# Patient Record
Sex: Female | Born: 1960 | Race: White | Hispanic: No | Smoking: Never smoker
Health system: Southern US, Community
[De-identification: ages and names within clinical notes are randomized; demographics above are authoritative.]

## PROBLEM LIST (undated history)

## (undated) DIAGNOSIS — D259 Leiomyoma of uterus, unspecified: Secondary | ICD-10-CM

## (undated) DIAGNOSIS — E559 Vitamin D deficiency, unspecified: Secondary | ICD-10-CM

## (undated) DIAGNOSIS — I10 Essential (primary) hypertension: Secondary | ICD-10-CM

## (undated) DIAGNOSIS — R7303 Prediabetes: Secondary | ICD-10-CM

## (undated) DIAGNOSIS — E785 Hyperlipidemia, unspecified: Secondary | ICD-10-CM

## (undated) DIAGNOSIS — M1990 Unspecified osteoarthritis, unspecified site: Secondary | ICD-10-CM

## (undated) HISTORY — DX: Vitamin D deficiency, unspecified: E55.9

## (undated) HISTORY — DX: Prediabetes: R73.03

## (undated) HISTORY — DX: Hyperlipidemia, unspecified: E78.5

## (undated) HISTORY — PX: MOUTH SURGERY: SHX715

## (undated) HISTORY — DX: Leiomyoma of uterus, unspecified: D25.9

## (undated) HISTORY — DX: Essential (primary) hypertension: I10

## (undated) HISTORY — DX: Unspecified osteoarthritis, unspecified site: M19.90

---

## 1986-07-02 HISTORY — PX: TUBAL LIGATION: SHX77

## 2001-05-30 ENCOUNTER — Other Ambulatory Visit: Admission: RE | Admit: 2001-05-30 | Discharge: 2001-05-30 | Payer: Self-pay | Admitting: Specialist

## 2003-08-13 ENCOUNTER — Other Ambulatory Visit: Admission: RE | Admit: 2003-08-13 | Discharge: 2003-08-13 | Payer: Self-pay | Admitting: Family Medicine

## 2020-06-05 ENCOUNTER — Encounter: Payer: Self-pay | Admitting: Physician Assistant

## 2020-06-05 ENCOUNTER — Ambulatory Visit (INDEPENDENT_AMBULATORY_CARE_PROVIDER_SITE_OTHER): Payer: 59 | Admitting: Physician Assistant

## 2020-06-05 ENCOUNTER — Other Ambulatory Visit: Payer: Self-pay

## 2020-06-05 VITALS — BP 212/91 | HR 73 | Temp 98.3°F | Resp 20 | Ht 70.0 in | Wt 232.0 lb

## 2020-06-05 DIAGNOSIS — I10 Essential (primary) hypertension: Secondary | ICD-10-CM

## 2020-06-05 DIAGNOSIS — M25561 Pain in right knee: Secondary | ICD-10-CM | POA: Diagnosis not present

## 2020-06-05 MED ORDER — LISINOPRIL 5 MG PO TABS
5.0000 mg | ORAL_TABLET | Freq: Every day | ORAL | 0 refills | Status: DC
Start: 2020-06-05 — End: 2020-07-03

## 2020-06-05 NOTE — Patient Instructions (Signed)

## 2020-06-05 NOTE — Progress Notes (Signed)
  Subjective:     Patient ID: Kara Mathis, female   DOB: 26-Apr-1961, 59 y.o.   MRN: [id number]  HPI Pt here to establish care States it has been around 47yrs since last visit for continued care Has had intermit sick visits with the last being 3-4 years ago Only concern today is R knee pain and HTN Denies injury to the R knee  Stands for long hours and has noted pain and swelling Sx have improved with OTC NSAIDS No prev tx for HTN  Review of Systems  Constitutional: Negative.   Respiratory: Negative.   Cardiovascular: Negative.   Gastrointestinal: Negative.   Musculoskeletal: Positive for arthralgias and joint swelling.  Skin: Negative.   Psychiatric/Behavioral: Negative.        Objective:   Physical Exam Vitals and nursing note reviewed.  Constitutional:      General: She is not in acute distress.    Appearance: Normal appearance. She is not ill-appearing or toxic-appearing.  Neck:     Vascular: No carotid bruit.  Cardiovascular:     Rate and Rhythm: Normal rate and regular rhythm.     Pulses: Normal pulses.     Heart sounds: Normal heart sounds.  Pulmonary:     Effort: Pulmonary effort is normal.     Breath sounds: Normal breath sounds.  Musculoskeletal:        General: Swelling present. Normal range of motion.     Cervical back: Normal range of motion and neck supple. No rigidity or tenderness.     Comments: General edema to the R knee FROM of the knee General TTP + patellar crepitus with ROM Good strength distal No laxity noted  Lymphadenopathy:     Cervical: No cervical adenopathy.  Skin:    General: Skin is warm and dry.  Neurological:     Mental Status: She is alert.        Assessment:     1. Hypertension, unspecified type   2. Recurrent pain of right knee        Plan:     Due to BP readings and FH will go ahead and start Zestril 5 mg qd CMP/Lipid panel pending and will inform of results Pt has kept up with mammogram/US due to fibrocystic  dz Pt keeps regular eye appt She is needing dental visit Nl course of knee pain reviewed Ice/Heat OTC meds prn F/U in 1 month for HTN

## 2020-06-06 LAB — CMP14+EGFR
ALT: 26 IU/L (ref 0–32)
AST: 18 IU/L (ref 0–40)
Albumin/Globulin Ratio: 1.7 (ref 1.2–2.2)
Albumin: 4.2 g/dL (ref 3.8–4.9)
Alkaline Phosphatase: 71 IU/L (ref 48–121)
BUN/Creatinine Ratio: 18 (ref 9–23)
BUN: 10 mg/dL (ref 6–24)
Bilirubin Total: 0.4 mg/dL (ref 0.0–1.2)
CO2: 25 mmol/L (ref 20–29)
Calcium: 9.1 mg/dL (ref 8.7–10.2)
Chloride: 104 mmol/L (ref 96–106)
Creatinine, Ser: 0.55 mg/dL — ABNORMAL LOW (ref 0.57–1.00)
GFR calc Af Amer: 119 mL/min/{1.73_m2} (ref >59)
GFR calc non Af Amer: 103 mL/min/{1.73_m2} (ref >59)
Globulin, Total: 2.5 g/dL (ref 1.5–4.5)
Glucose: 112 mg/dL — ABNORMAL HIGH (ref 65–99)
Potassium: 4.1 mmol/L (ref 3.5–5.2)
Sodium: 143 mmol/L (ref 134–144)
Total Protein: 6.7 g/dL (ref 6.0–8.5)

## 2020-06-06 LAB — LIPID PANEL
Chol/HDL Ratio: 4.5 ratio — ABNORMAL HIGH (ref 0.0–4.4)
Cholesterol, Total: 223 mg/dL — ABNORMAL HIGH (ref 100–199)
HDL: 50 mg/dL (ref >39)
LDL Chol Calc (NIH): 148 mg/dL — ABNORMAL HIGH (ref 0–99)
Triglycerides: 141 mg/dL (ref 0–149)
VLDL Cholesterol Cal: 25 mg/dL (ref 5–40)

## 2020-06-12 ENCOUNTER — Telehealth: Payer: Self-pay | Admitting: Family Medicine

## 2020-06-12 NOTE — Telephone Encounter (Signed)
Aware of lab results  

## 2020-06-24 ENCOUNTER — Encounter: Payer: Self-pay | Admitting: Family Medicine

## 2020-07-03 ENCOUNTER — Other Ambulatory Visit: Payer: Self-pay

## 2020-07-03 ENCOUNTER — Encounter: Payer: Self-pay | Admitting: Family Medicine

## 2020-07-03 ENCOUNTER — Ambulatory Visit (INDEPENDENT_AMBULATORY_CARE_PROVIDER_SITE_OTHER): Payer: 59 | Admitting: Family Medicine

## 2020-07-03 VITALS — BP 192/105 | HR 70 | Temp 97.7°F | Ht 70.0 in | Wt 228.8 lb

## 2020-07-03 DIAGNOSIS — R7301 Impaired fasting glucose: Secondary | ICD-10-CM

## 2020-07-03 DIAGNOSIS — I10 Essential (primary) hypertension: Secondary | ICD-10-CM | POA: Diagnosis not present

## 2020-07-03 DIAGNOSIS — E782 Mixed hyperlipidemia: Secondary | ICD-10-CM

## 2020-07-03 DIAGNOSIS — E119 Type 2 diabetes mellitus without complications: Secondary | ICD-10-CM

## 2020-07-03 DIAGNOSIS — M1990 Unspecified osteoarthritis, unspecified site: Secondary | ICD-10-CM

## 2020-07-03 DIAGNOSIS — E785 Hyperlipidemia, unspecified: Secondary | ICD-10-CM | POA: Insufficient documentation

## 2020-07-03 HISTORY — DX: Type 2 diabetes mellitus without complications: E11.9

## 2020-07-03 LAB — BAYER DCA HB A1C WAIVED: HB A1C (BAYER DCA - WAIVED): 6.8 % (ref <7.0)

## 2020-07-03 MED ORDER — LISINOPRIL 10 MG PO TABS: 10.0000 mg | ORAL_TABLET | Freq: Every day | ORAL | 2 refills | Status: DC

## 2020-07-03 NOTE — Progress Notes (Signed)
Assessment & Plan:  1. Essential hypertension - Uncontrolled. Lisinopril increased from 5 mg to 10 mg once daily.  Education provided on the DASH diet. - lisinopril (ZESTRIL) 10 MG tablet; Take 1 tablet (10 mg total) by mouth daily.  Dispense: 30 tablet; Refill: 2  2. Impaired fasting glucose - Encouraged to continue modifying diet. - Bayer DCA Hb A1c Waived = 6.8  3. Mixed hyperlipidemia - Education provided on hyperlipidemia.  Encouraged to work on diet.  4. Arthritis - Encouraged her to try ibuprofen and/or Voltaren gel to her right knee.  Discussed option of steroid injections at some point if she needs them.  Does not wish to do an x-ray today.   Return in about 4 weeks (around 07/31/2020) for HTN.  [name], MSN, APRN, FNP-C [facility]  Subjective:    Patient ID: Kara Mathis, female    DOB: [birth year], 60 y.o.   MRN: [id number]  Patient Care Team: [name], FNP as PCP - General (Family Medicine)   Chief Complaint:  Chief Complaint  Patient presents with  . Establish Care    Seen Bill for first visit.   Kara Mathis Hyperglycemia    A1c needed per last lab work.  . Hypertension    1 month follow up  . Arthritis    bilateral knees- 1 month follow up    HPI: Kara Mathis is a 59 y.o. female presenting on 07/03/2020 for Kara Mathis (Seen Bill for first visit. ), Hyperglycemia (A1c needed per last lab work.), Hypertension (1 month follow up), and Arthritis (bilateral knees- 1 month follow up)  Patient was seen 4 weeks ago at which time she was started on lisinopril 5 mg once daily.  She does not check her blood pressure at home.  She does not do any exercise.  She is drinking more water and has cut out fried foods and sweets.  Patient advised A1c would need to be ordered today due to fasting glucose of 112.  Patient's cholesterol levels were also elevated on her lab work.  Her ASCVD risk score is 10.3%  Patient has arthritis in her  right knee.  She is up on her feet all day at work at [employer].  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  She feels being up on her feet all day and compensating for the pain in her right knee is making her left knee hurt more.  She does wear a sleeve on her right knee every day.  She takes Tylenol 500 mg at bedtime which she reports is effective.  She does use " roll-on cooling pain relief" which is effective as well.  She does have two old goats cream and Voltaren gel at home that she has not yet tried.  She uses a walker at night and a cane during the day if she is not at work.  She does not use any assistive device at work.  She has not tried NSAIDs as she reports Aleve makes her heart race.  She cannot tolerate ibuprofen.   Social history:  Relevant past medical, surgical, family and social history reviewed and updated as indicated. Interim medical history since our last visit reviewed.  Allergies and medications reviewed and updated.  DATA REVIEWED: CHART IN EPIC  ROS: Negative unless specifically indicated above in HPI.    Current Outpatient Medications:  .  lisinopril (ZESTRIL) 5 MG tablet, Take 1 tablet (5 mg total) by  mouth daily., Disp: 30 tablet, Rfl: 0   Allergies  Allergen Reactions  . Asa [Aspirin]     Shaky and dizzy    Past Medical History:  Diagnosis Date  . Arthritis   . Hyperlipidemia   . Hypertension   . Type 2 diabetes mellitus ([place]) 07/03/2020   A1c 6.8  . Uterine fibroid   . Vitamin D deficiency     Past Surgical History:  Procedure Laterality Date  . MOUTH SURGERY    . TUBAL LIGATION  07/02/1986    Social History   Socioeconomic History  . Marital status: Widowed    Spouse name: Not on file  . Number of children: Not on file  . Years of education: Not on file  . Highest education level: Not on file  Occupational History  . Not on file  Tobacco Use  . Smoking status: Never Smoker  . Smokeless tobacco:  Never Used  Substance and Sexual Activity  . Alcohol use: Never  . Drug use: Never  . Sexual activity: Not on file  Other Topics Concern  . Not on file  Social History Narrative  . Not on file   Social Determinants of Health   Financial Resource Strain:   . Difficulty of Paying Living Expenses:   Food Insecurity:   . Worried About Charity fundraiser in the Last Year:   . Arboriculturist in the Last Year:   Transportation Needs:   . Film/video editor (Medical):   Kara Mathis Lack of Transportation (Non-Medical):   Physical Activity:   . Days of Exercise per Week:   . Minutes of Exercise per Session:   Stress:   . Feeling of Stress :   Social Connections:   . Frequency of Communication with Friends and Family:   . Frequency of Social Gatherings with Friends and Family:   . Attends Religious Services:   . Active Member of Clubs or Organizations:   . Attends Archivist Meetings:   Kara Mathis Marital Status:   Intimate Partner Violence:   . Fear of Current or Ex-Partner:   . Emotionally Abused:   Kara Mathis Physically Abused:   . Sexually Abused:         Objective:    BP (!) 192/105   Pulse 70   Temp 97.7 F (36.5 C) (Temporal)   Ht 5\' 10"  (1.778 m)   Wt 228 lb 12.8 oz (103.8 kg)   LMP 03/17/2017   SpO2 100%   BMI 32.83 kg/m   Wt Readings from Last 3 Encounters:  07/03/20 228 lb 12.8 oz (103.8 kg)  06/05/20 232 lb (105.2 kg)    Physical Exam Vitals reviewed.  Constitutional:      General: She is not in acute distress.    Appearance: Normal appearance. She is obese. She is not ill-appearing, toxic-appearing or diaphoretic.  HENT:     Head: Normocephalic and atraumatic.  Eyes:     General: No scleral icterus.       Right eye: No discharge.        Left eye: No discharge.     Conjunctiva/sclera: Conjunctivae normal.  Cardiovascular:     Rate and Rhythm: Normal rate and regular rhythm.     Heart sounds: Normal heart sounds. No murmur heard.  No friction rub. No  gallop.   Pulmonary:     Effort: Pulmonary effort is normal. No respiratory distress.     Breath sounds: Normal breath sounds. No stridor. No wheezing,  rhonchi or rales.  Musculoskeletal:        General: Normal range of motion.     Cervical back: Normal range of motion.  Skin:    General: Skin is warm and dry.     Capillary Refill: Capillary refill takes less than 2 seconds.  Neurological:     General: No focal deficit present.     Mental Status: She is alert and oriented to person, place, and time. Mental status is at baseline.     Gait: Gait abnormal (ambulates with cane).  Psychiatric:        Mood and Affect: Mood normal.        Behavior: Behavior normal.        Thought Content: Thought content normal.        Judgment: Judgment normal.     No results found for: TSH No results found for: WBC, HGB, HCT, MCV, PLT Lab Results  Component Value Date   NA 143 06/05/2020   K 4.1 06/05/2020   CO2 25 06/05/2020   GLUCOSE 112 (H) 06/05/2020   BUN 10 06/05/2020   CREATININE 0.55 (L) 06/05/2020   BILITOT 0.4 06/05/2020   ALKPHOS 71 06/05/2020   AST 18 06/05/2020   ALT 26 06/05/2020   PROT 6.7 06/05/2020   ALBUMIN 4.2 06/05/2020   CALCIUM 9.1 06/05/2020   Lab Results  Component Value Date   CHOL 223 (H) 06/05/2020   Lab Results  Component Value Date   HDL 50 06/05/2020   Lab Results  Component Value Date   LDLCALC 148 (H) 06/05/2020   Lab Results  Component Value Date   TRIG 141 06/05/2020   Lab Results  Component Value Date   CHOLHDL 4.5 (H) 06/05/2020   Lab Results  Component Value Date   HGBA1C 6.8 07/03/2020

## 2020-07-03 NOTE — Patient Instructions (Addendum)
DASH Eating Plan DASH stands for "Dietary Approaches to Stop Hypertension." The DASH eating plan is a healthy eating plan that has been shown to reduce high blood pressure (hypertension). It may also reduce your risk for type 2 diabetes, heart disease, and stroke. The DASH eating plan may also help with weight loss. What are tips for following this plan?  General guidelines  Avoid eating more than 2,300 mg (milligrams) of salt (sodium) a day. If you have hypertension, you may need to reduce your sodium intake to 1,500 mg a day.  Limit alcohol intake to no more than 1 drink a day for nonpregnant women and 2 drinks a day for men. One drink equals 12 oz of beer, 5 oz of wine, or 1 oz of hard liquor.  Work with your health care provider to maintain a healthy body weight or to lose weight. Ask what an ideal weight is for you.  Get at least 30 minutes of exercise that causes your heart to beat faster (aerobic exercise) most days of the week. Activities may include walking, swimming, or biking.  Work with your health care provider or diet and nutrition specialist (dietitian) to adjust your eating plan to your individual calorie needs. Reading food labels   Check food labels for the amount of sodium per serving. Choose foods with less than 5 percent of the Daily Value of sodium. Generally, foods with less than 300 mg of sodium per serving fit into this eating plan.  To find whole grains, look for the word "whole" as the first word in the ingredient list. Shopping  Buy products labeled as "low-sodium" or "no salt added."  Buy fresh foods. Avoid canned foods and premade or frozen meals. Cooking  Avoid adding salt when cooking. Use salt-free seasonings or herbs instead of table salt or sea salt. Check with your health care provider or pharmacist before using salt substitutes.  Do not fry foods. Cook foods using healthy methods such as baking, boiling, grilling, and broiling instead.  Cook  with heart-healthy oils, such as olive, canola, soybean, or sunflower oil. Meal planning  Eat a balanced diet that includes: ? 5 or more servings of fruits and vegetables each day. At each meal, try to fill half of your plate with fruits and vegetables. ? Up to 6-8 servings of whole grains each day. ? Less than 6 oz of lean meat, poultry, or fish each day. A 3-oz serving of meat is about the same size as a deck of cards. One egg equals 1 oz. ? 2 servings of low-fat dairy each day. ? A serving of nuts, seeds, or beans 5 times each week. ? Heart-healthy fats. Healthy fats called Omega-3 fatty acids are found in foods such as flaxseeds and coldwater fish, like sardines, salmon, and mackerel.  Limit how much you eat of the following: ? Canned or prepackaged foods. ? Food that is high in trans fat, such as fried foods. ? Food that is high in saturated fat, such as fatty meat. ? Sweets, desserts, sugary drinks, and other foods with added sugar. ? Full-fat dairy products.  Do not salt foods before eating.  Try to eat at least 2 vegetarian meals each week.  Eat more home-cooked food and less restaurant, buffet, and fast food.  When eating at a restaurant, ask that your food be prepared with less salt or no salt, if possible. What foods are recommended? The items listed may not be a complete list. Talk with your dietitian  about what dietary choices are best for you. Grains Whole-grain or whole-wheat bread. Whole-grain or whole-wheat pasta. Brown rice. [place]. Bulgur. Whole-grain and low-sodium cereals. Pita bread. Low-fat, low-sodium crackers. Whole-wheat flour tortillas. Vegetables Fresh or frozen vegetables (raw, steamed, roasted, or grilled). Low-sodium or reduced-sodium tomato and vegetable juice. Low-sodium or reduced-sodium tomato sauce and tomato paste. Low-sodium or reduced-sodium canned vegetables. Fruits All fresh, dried, or frozen fruit. Canned fruit in natural juice  (without added sugar). Meat and other protein foods Skinless chicken or [place]. Ground chicken or [place]. Pork with fat trimmed off. Fish and seafood. Egg whites. Dried beans, peas, or lentils. Unsalted nuts, nut butters, and seeds. Unsalted canned beans. Lean cuts of beef with fat trimmed off. Low-sodium, lean deli meat. Dairy Low-fat (1%) or fat-free (skim) milk. Fat-free, low-fat, or reduced-fat cheeses. Nonfat, low-sodium ricotta or cottage cheese. Low-fat or nonfat yogurt. Low-fat, low-sodium cheese. Fats and oils Soft margarine without trans fats. Vegetable oil. Low-fat, reduced-fat, or light mayonnaise and salad dressings (reduced-sodium). Canola, safflower, olive, soybean, and sunflower oils. Avocado. Seasoning and other foods Herbs. Spices. Seasoning mixes without salt. Unsalted popcorn and pretzels. Fat-free sweets. What foods are not recommended? The items listed may not be a complete list. Talk with your dietitian about what dietary choices are best for you. Grains Baked goods made with fat, such as croissants, muffins, or some breads. Dry pasta or rice meal packs. Vegetables Creamed or fried vegetables. Vegetables in a cheese sauce. Regular canned vegetables (not low-sodium or reduced-sodium). Regular canned tomato sauce and paste (not low-sodium or reduced-sodium). Regular tomato and vegetable juice (not low-sodium or reduced-sodium). Angie Fava. Olives. Fruits Canned fruit in a light or heavy syrup. Fried fruit. Fruit in cream or butter sauce. Meat and other protein foods Fatty cuts of meat. Ribs. Fried meat. Berniece Salines. Sausage. Bologna and other processed lunch meats. Salami. Fatback. Hotdogs. Bratwurst. Salted nuts and seeds. Canned beans with added salt. Canned or smoked fish. Whole eggs or egg yolks. Chicken or [place] with skin. Dairy Whole or 2% milk, cream, and half-and-half. Whole or full-fat cream cheese. Whole-fat or sweetened yogurt. Full-fat cheese. Nondairy creamers. Whipped  toppings. Processed cheese and cheese spreads. Fats and oils Butter. Stick margarine. Lard. Shortening. Ghee. Bacon fat. Tropical oils, such as coconut, palm kernel, or palm oil. Seasoning and other foods Salted popcorn and pretzels. Onion salt, garlic salt, seasoned salt, table salt, and sea salt. Worcestershire sauce. Tartar sauce. Barbecue sauce. Teriyaki sauce. Soy sauce, including reduced-sodium. Steak sauce. Canned and packaged gravies. Fish sauce. Oyster sauce. Cocktail sauce. Horseradish that you find on the shelf. Ketchup. Mustard. Meat flavorings and tenderizers. Bouillon cubes. Hot sauce and Tabasco sauce. Premade or packaged marinades. Premade or packaged taco seasonings. Relishes. Regular salad dressings. Where to find more information:  National Heart, Lung, and [place]: https://wilson-eaton.com/  American Heart Association: www.heart.org Summary  The DASH eating plan is a healthy eating plan that has been shown to reduce high blood pressure (hypertension). It may also reduce your risk for type 2 diabetes, heart disease, and stroke.  With the DASH eating plan, you should limit salt (sodium) intake to 2,300 mg a day. If you have hypertension, you may need to reduce your sodium intake to 1,500 mg a day.  When on the DASH eating plan, aim to eat more fresh fruits and vegetables, whole grains, lean proteins, low-fat dairy, and heart-healthy fats.  Work with your health care provider or diet and nutrition specialist (dietitian) to adjust your eating plan to  your individual calorie needs. This information is not intended to replace advice given to you by your health care provider. Make sure you discuss any questions you have with your health care provider. Document Revised: 11/26/2017 Document Reviewed: 12/07/2016 Elsevier Patient Education  [place].   High Cholesterol  High cholesterol is a condition in which the blood has high levels of a white, waxy, fat-like substance  (cholesterol). The human body needs small amounts of cholesterol. The liver makes all the cholesterol that the body needs. Extra (excess) cholesterol comes from the food that we eat. Cholesterol is carried from the liver by the blood through the blood vessels. If you have high cholesterol, deposits (plaques) may build up on the walls of your blood vessels (arteries). Plaques make the arteries narrower and stiffer. Cholesterol plaques increase your risk for heart attack and stroke. Work with your health care provider to keep your cholesterol levels in a healthy range. What increases the risk? This condition is more likely to develop in people who:  Eat foods that are high in animal fat (saturated fat) or cholesterol.  Are overweight.  Are not getting enough exercise.  Have a family history of high cholesterol. What are the signs or symptoms? There are no symptoms of this condition. How is this diagnosed? This condition may be diagnosed from the results of a blood test.  If you are older than age 65, your health care provider may check your cholesterol every 4-6 years.  You may be checked more often if you already have high cholesterol or other risk factors for heart disease. The blood test for cholesterol measures:  "Bad" cholesterol (LDL cholesterol). This is the main type of cholesterol that causes heart disease. The desired level for LDL is less than 100.  "Good" cholesterol (HDL cholesterol). This type helps to protect against heart disease by cleaning the arteries and carrying the LDL away. The desired level for HDL is 60 or higher.  Triglycerides. These are fats that the body can store or burn for energy. The desired number for triglycerides is lower than 150.  Total cholesterol. This is a measure of the total amount of cholesterol in your blood, including LDL cholesterol, HDL cholesterol, and triglycerides. A healthy number is less than 200. How is this treated? This condition is  treated with diet changes, lifestyle changes, and medicines. Diet changes  This may include eating more whole grains, fruits, vegetables, nuts, and fish.  This may also include cutting back on red meat and foods that have a lot of added sugar. Lifestyle changes  Changes may include getting at least 40 minutes of aerobic exercise 3 times a week. Aerobic exercises include walking, biking, and swimming. Aerobic exercise along with a healthy diet can help you maintain a healthy weight.  Changes may also include quitting smoking. Medicines  Medicines are usually given if diet and lifestyle changes have failed to reduce your cholesterol to healthy levels.  Your health care provider may prescribe a statin medicine. Statin medicines have been shown to reduce cholesterol, which can reduce the risk of heart disease. Follow these instructions at home: Eating and drinking If told by your health care provider:  Eat chicken (without skin), fish, veal, shellfish, ground [place] breast, and round or loin cuts of red meat.  Do not eat fried foods or fatty meats, such as hot dogs and salami.  Eat plenty of fruits, such as apples.  Eat plenty of vegetables, such as broccoli, potatoes, and carrots.  Eat beans, peas, and lentils.  Eat grains such as barley, rice, couscous, and bulgur wheat.  Eat pasta without cream sauces.  Use skim or nonfat milk, and eat low-fat or nonfat yogurt and cheeses.  Do not eat or drink whole milk, cream, ice cream, egg yolks, or hard cheeses.  Do not eat stick margarine or tub margarines that contain trans fats (also called partially hydrogenated oils).  Do not eat saturated tropical oils, such as coconut oil and palm oil.  Do not eat cakes, cookies, crackers, or other baked goods that contain trans fats.  General instructions  Exercise as directed by your health care provider. Increase your activity level with activities such as gardening, walking, and taking the  stairs.  Take over-the-counter and prescription medicines only as told by your health care provider.  Do not use any products that contain nicotine or tobacco, such as cigarettes and e-cigarettes. If you need help quitting, ask your health care provider.  Keep all follow-up visits as told by your health care provider. This is important. Contact a health care provider if:  You are struggling to maintain a healthy diet or weight.  You need help to start on an exercise program.  You need help to stop smoking. Get help right away if:  You have chest pain.  You have trouble breathing. This information is not intended to replace advice given to you by your health care provider. Make sure you discuss any questions you have with your health care provider. Document Revised: 12/17/2017 Document Reviewed: 06/13/2016 Elsevier Patient Education  [place].

## 2020-07-31 ENCOUNTER — Other Ambulatory Visit: Payer: Self-pay

## 2020-07-31 ENCOUNTER — Ambulatory Visit (INDEPENDENT_AMBULATORY_CARE_PROVIDER_SITE_OTHER): Payer: 59

## 2020-07-31 ENCOUNTER — Ambulatory Visit (INDEPENDENT_AMBULATORY_CARE_PROVIDER_SITE_OTHER): Payer: 59 | Admitting: Family Medicine

## 2020-07-31 ENCOUNTER — Encounter: Payer: Self-pay | Admitting: Family Medicine

## 2020-07-31 VITALS — BP 180/107 | HR 71 | Temp 96.6°F | Ht 70.0 in | Wt 225.4 lb

## 2020-07-31 DIAGNOSIS — M25561 Pain in right knee: Secondary | ICD-10-CM

## 2020-07-31 DIAGNOSIS — E119 Type 2 diabetes mellitus without complications: Secondary | ICD-10-CM | POA: Diagnosis not present

## 2020-07-31 DIAGNOSIS — Z1211 Encounter for screening for malignant neoplasm of colon: Secondary | ICD-10-CM

## 2020-07-31 DIAGNOSIS — I10 Essential (primary) hypertension: Secondary | ICD-10-CM

## 2020-07-31 MED ORDER — LISINOPRIL 20 MG PO TABS: 20.0000 mg | ORAL_TABLET | Freq: Every day | ORAL | 2 refills | Status: DC

## 2020-07-31 NOTE — Progress Notes (Signed)
Assessment & Plan:  1. Essential hypertension - Continue diet and exercise. Lisinopril increased from 10 mg to 20 mg once daily.  - lisinopril (ZESTRIL) 20 MG tablet; Take 1 tablet (20 mg total) by mouth daily.  Dispense: 30 tablet; Refill: 2  2. Recurrent pain of right knee - Knee injection at her next visit if x-ray is okay.  - DG Knee 1-2 Views Right  3. Controlled type 2 diabetes mellitus without complication, without long-term current use of insulin (HCC) - Microalbumin / creatinine urine ratio  4. Colon cancer screening - Cologuard   Return in about 3 weeks (around 08/21/2020) for HTN w. knee injection.  [name], MSN, APRN, FNP-C [facility]  Subjective:    Patient ID: Kara Mathis, female    DOB: [birth year], 60 y.o.   MRN: [id number]  Patient Care Team: [name], FNP as PCP - General (Family Medicine)   Chief Complaint:  Chief Complaint  Patient presents with   Hypertension    4 week follow up    HPI: Kara Mathis is a 59 y.o. female presenting on 07/31/2020 for Hypertension (4 week follow up)  Hypertension: Patient here for follow-up of elevated blood pressure. She is not exercising and is adherent to low salt diet.  Blood pressure is not well controlled at home. Cardiac symptoms none. Cardiovascular risk factors: diabetes mellitus, dyslipidemia, hypertension, obesity (BMI >= 30 kg/m2) and sedentary lifestyle. Use of agents associated with hypertension: none. History of target organ damage: none.   New complaints: Patient would like to proceed with an x-ray of her right knee today so that she can also proceed with a knee injection at her next visit.   Social history:  Relevant past medical, surgical, family and social history reviewed and updated as indicated. Interim medical history since our last visit reviewed.  Allergies and medications reviewed and updated.  DATA REVIEWED: CHART IN EPIC  ROS: Negative unless  specifically indicated above in HPI.    Current Outpatient Medications:    lisinopril (ZESTRIL) 10 MG tablet, Take 1 tablet (10 mg total) by mouth daily., Disp: 30 tablet, Rfl: 2   Allergies  Allergen Reactions   Asa [Aspirin]     Shaky and dizzy    Past Medical History:  Diagnosis Date   Arthritis    Hyperlipidemia    Hypertension    Type 2 diabetes mellitus ([place]) 07/03/2020   A1c 6.8   Uterine fibroid    Vitamin D deficiency     Past Surgical History:  Procedure Laterality Date   MOUTH SURGERY     TUBAL LIGATION  07/02/1986    Social History   Socioeconomic History   Marital status: Widowed    Spouse name: Not on file   Number of children: Not on file   Years of education: Not on file   Highest education level: Not on file  Occupational History   Not on file  Tobacco Use   Smoking status: Never Smoker   Smokeless tobacco: Never Used  Substance and Sexual Activity   Alcohol use: Never   Drug use: Never   Sexual activity: Not on file  Other Topics Concern   Not on file  Social History Narrative   Not on file   Social Determinants of Health   Financial Resource Strain:    Difficulty of Paying Living Expenses:   Food Insecurity:    Worried About [place] in the Last Year:    Ran  Out of Food in the Last Year:   Transportation Needs:    Lack of Transportation (Medical):    Lack of Transportation (Non-Medical):   Physical Activity:    Days of Exercise per Week:    Minutes of Exercise per Session:   Stress:    Feeling of Stress :   Social Connections:    Frequency of Communication with Friends and Family:    Frequency of Social Gatherings with Friends and Family:    Attends Religious Services:    Active Member of Clubs or Organizations:    Attends Archivist Meetings:    Marital Status:   Intimate Partner Violence:    Fear of Current or Ex-Partner:    Emotionally Abused:    Physically  Abused:    Sexually Abused:         Objective:    BP (!) 180/107    Pulse 71    Temp (!) 96.6 F (35.9 C) (Temporal)    Ht 5\' 10"  (1.778 m)    Wt 225 lb 6.4 oz (102.2 kg)    LMP 03/17/2017    SpO2 99%    BMI 32.34 kg/m   Wt Readings from Last 3 Encounters:  07/31/20 225 lb 6.4 oz (102.2 kg)  07/03/20 228 lb 12.8 oz (103.8 kg)  06/05/20 232 lb (105.2 kg)    Physical Exam Vitals reviewed.  Constitutional:      General: She is not in acute distress.    Appearance: Normal appearance. She is obese. She is not ill-appearing, toxic-appearing or diaphoretic.  HENT:     Head: Normocephalic and atraumatic.  Eyes:     General: No scleral icterus.       Right eye: No discharge.        Left eye: No discharge.     Conjunctiva/sclera: Conjunctivae normal.  Cardiovascular:     Rate and Rhythm: Normal rate and regular rhythm.     Heart sounds: Normal heart sounds. No murmur heard.  No friction rub. No gallop.   Pulmonary:     Effort: Pulmonary effort is normal. No respiratory distress.     Breath sounds: Normal breath sounds. No stridor. No wheezing, rhonchi or rales.  Musculoskeletal:        General: Normal range of motion.     Cervical back: Normal range of motion.  Skin:    General: Skin is warm and dry.     Capillary Refill: Capillary refill takes less than 2 seconds.  Neurological:     General: No focal deficit present.     Mental Status: She is alert and oriented to person, place, and time. Mental status is at baseline.  Psychiatric:        Mood and Affect: Mood normal.        Behavior: Behavior normal.        Thought Content: Thought content normal.        Judgment: Judgment normal.     No results found for: TSH No results found for: WBC, HGB, HCT, MCV, PLT Lab Results  Component Value Date   NA 143 06/05/2020   K 4.1 06/05/2020   CO2 25 06/05/2020   GLUCOSE 112 (H) 06/05/2020   BUN 10 06/05/2020   CREATININE 0.55 (L) 06/05/2020   BILITOT 0.4 06/05/2020    ALKPHOS 71 06/05/2020   AST 18 06/05/2020   ALT 26 06/05/2020   PROT 6.7 06/05/2020   ALBUMIN 4.2 06/05/2020   CALCIUM 9.1 06/05/2020   Lab  Results  Component Value Date   CHOL 223 (H) 06/05/2020   Lab Results  Component Value Date   HDL 50 06/05/2020   Lab Results  Component Value Date   LDLCALC 148 (H) 06/05/2020   Lab Results  Component Value Date   TRIG 141 06/05/2020   Lab Results  Component Value Date   CHOLHDL 4.5 (H) 06/05/2020   Lab Results  Component Value Date   HGBA1C 6.8 07/03/2020

## 2020-08-01 LAB — MICROALBUMIN / CREATININE URINE RATIO
Creatinine, Urine: 79.5 mg/dL
Microalb/Creat Ratio: 6 mg/g creat (ref 0–29)
Microalbumin, Urine: 4.8 ug/mL

## 2020-08-28 ENCOUNTER — Encounter: Payer: Self-pay | Admitting: Family Medicine

## 2020-08-28 ENCOUNTER — Other Ambulatory Visit: Payer: Self-pay

## 2020-08-28 ENCOUNTER — Ambulatory Visit (INDEPENDENT_AMBULATORY_CARE_PROVIDER_SITE_OTHER): Payer: 59 | Admitting: Family Medicine

## 2020-08-28 VITALS — BP 152/77 | HR 67 | Temp 97.7°F | Ht 70.0 in | Wt 218.6 lb

## 2020-08-28 DIAGNOSIS — M25561 Pain in right knee: Secondary | ICD-10-CM | POA: Diagnosis not present

## 2020-08-28 DIAGNOSIS — E669 Obesity, unspecified: Secondary | ICD-10-CM | POA: Diagnosis not present

## 2020-08-28 DIAGNOSIS — I10 Essential (primary) hypertension: Secondary | ICD-10-CM

## 2020-08-28 MED ORDER — METHYLPREDNISOLONE ACETATE 80 MG/ML IJ SUSP
80.0000 mg | Freq: Once | INTRAMUSCULAR | Status: AC
  Administered 2020-08-28: 80 mg via INTRAMUSCULAR

## 2020-08-28 MED ORDER — LISINOPRIL 40 MG PO TABS: 40.0000 mg | ORAL_TABLET | Freq: Every day | ORAL | 2 refills | Status: DC

## 2020-08-28 NOTE — Progress Notes (Signed)
Assessment & Plan:  1. Essential hypertension - Improving. Lisinopril increased from 20 mg to 40 mg once daily.  Patient to continue monitoring at home.  Also to continue DASH diet. - lisinopril (ZESTRIL) 40 MG tablet; Take 1 tablet (40 mg total) by mouth daily.  Dispense: 30 tablet; Refill: 2  2. Obesity (BMI 30.0-34.9) - Patient has lost 10 pounds in the past 2 month, 7 of which were in the past month.  Encouraged to keep up the dieting.  3. Recurrent pain of right knee - Right knee injection performed.  Patient tolerated well. - methylPREDNISolone acetate (DEPO-MEDROL) injection 80 mg   Return in about 6 weeks (around 10/09/2020) for follow-up of chronic medication conditions.  [name], MSN, APRN, FNP-C [facility]  Subjective:    Patient ID: Kara Mathis, female    DOB: Sep 07, 1961, 59 y.o.   MRN: [id number]  Patient Care Team: [name], FNP as PCP - General (Family Medicine)   Chief Complaint:  Chief Complaint  Patient presents with   Hypertension    3 week follow up   Knee Pain    requesting right knee injection    HPI: Kara Mathis is a 59 y.o. female presenting on 08/28/2020 for Hypertension (3 week follow up) and Knee Pain (requesting right knee injection)  Patient is here for follow-up of hypertension.  At her last visit lisinopril was increased from 10 mg to 20 mg once daily.  She has been eating by the DASH diet.  Her blood pressure has remained elevated at home, but is improving.  She is also getting a right knee injection today.  New complaints: None  Social history:  Relevant past medical, surgical, family and social history reviewed and updated as indicated. Interim medical history since our last visit reviewed.  Allergies and medications reviewed and updated.  DATA REVIEWED: CHART IN EPIC  ROS: Negative unless specifically indicated above in HPI.    Current Outpatient Medications:    lisinopril (ZESTRIL)  20 MG tablet, Take 1 tablet (20 mg total) by mouth daily., Disp: 30 tablet, Rfl: 2   Allergies  Allergen Reactions   Asa [Aspirin]     Shaky and dizzy    Past Medical History:  Diagnosis Date   Arthritis    Hyperlipidemia    Hypertension    Type 2 diabetes mellitus ([place]) 07/03/2020   A1c 6.8   Uterine fibroid    Vitamin D deficiency     Past Surgical History:  Procedure Laterality Date   MOUTH SURGERY     TUBAL LIGATION  07/02/1986    Social History   Socioeconomic History   Marital status: Widowed    Spouse name: Not on file   Number of children: Not on file   Years of education: Not on file   Highest education level: Not on file  Occupational History   Not on file  Tobacco Use   Smoking status: Never Smoker   Smokeless tobacco: Never Used  Substance and Sexual Activity   Alcohol use: Never   Drug use: Never   Sexual activity: Not on file  Other Topics Concern   Not on file  Social History Narrative   Not on file   Social Determinants of Health   Financial Resource Strain:    Difficulty of Paying Living Expenses: Not on file  Food Insecurity:    Worried About [place] in the Last Year: Not on file   Ran Out of  Food in the Last Year: Not on file  Transportation Needs:    Lack of Transportation (Medical): Not on file   Lack of Transportation (Non-Medical): Not on file  Physical Activity:    Days of Exercise per Week: Not on file   Minutes of Exercise per Session: Not on file  Stress:    Feeling of Stress : Not on file  Social Connections:    Frequency of Communication with Friends and Family: Not on file   Frequency of Social Gatherings with Friends and Family: Not on file   Attends Religious Services: Not on file   Active Member of Clubs or Organizations: Not on file   Attends Archivist Meetings: Not on file   Marital Status: Not on file  Intimate Partner Violence:    Fear of Current or  Ex-Partner: Not on file   Emotionally Abused: Not on file   Physically Abused: Not on file   Sexually Abused: Not on file        Objective:    BP (!) 152/77    Pulse 67    Temp 97.7 F (36.5 C) (Temporal)    Ht 5\' 10"  (1.778 m)    Wt 218 lb 9.6 oz (99.2 kg)    LMP 03/17/2017    SpO2 100%    BMI 31.37 kg/m   Wt Readings from Last 3 Encounters:  08/28/20 218 lb 9.6 oz (99.2 kg)  07/31/20 225 lb 6.4 oz (102.2 kg)  07/03/20 228 lb 12.8 oz (103.8 kg)     Physical Exam Vitals reviewed.  Constitutional:      General: She is not in acute distress.    Appearance: Normal appearance. She is obese. She is not ill-appearing, toxic-appearing or diaphoretic.  HENT:     Head: Normocephalic and atraumatic.  Eyes:     General: No scleral icterus.       Right eye: No discharge.        Left eye: No discharge.     Conjunctiva/sclera: Conjunctivae normal.  Cardiovascular:     Rate and Rhythm: Normal rate and regular rhythm.     Heart sounds: Normal heart sounds. No murmur heard.  No friction rub. No gallop.   Pulmonary:     Effort: Pulmonary effort is normal. No respiratory distress.     Breath sounds: Normal breath sounds. No stridor. No wheezing, rhonchi or rales.  Musculoskeletal:        General: Normal range of motion.     Cervical back: Normal range of motion.  Skin:    General: Skin is warm and dry.     Capillary Refill: Capillary refill takes less than 2 seconds.  Neurological:     General: No focal deficit present.     Mental Status: She is alert and oriented to person, place, and time. Mental status is at baseline.     Gait: Gait abnormal (ambulated with cane).  Psychiatric:        Mood and Affect: Mood normal.        Behavior: Behavior normal.        Thought Content: Thought content normal.        Judgment: Judgment normal.    Joint Injection/Arthrocentesis  Date/Time: 08/28/2020 10:00 AM Performed by: [name], FNP Authorized by: [name], FNP    Indications: pain  Body area: knee Joint: right knee Local anesthesia used: yes  Anesthesia: Local anesthesia used: yes Local Anesthetic: topical anesthetic  Sedation: Patient sedated: no  Needle gauge: 25G. Ultrasound guidance: no Approach: medial Methylprednisolone amount: 80 mg Lidocaine 2% amount: 1 mL Patient tolerance: patient tolerated the procedure well with no immediate complications    No results found for: TSH No results found for: WBC, HGB, HCT, MCV, PLT Lab Results  Component Value Date   NA 143 06/05/2020   K 4.1 06/05/2020   CO2 25 06/05/2020   GLUCOSE 112 (H) 06/05/2020   BUN 10 06/05/2020   CREATININE 0.55 (L) 06/05/2020   BILITOT 0.4 06/05/2020   ALKPHOS 71 06/05/2020   AST 18 06/05/2020   ALT 26 06/05/2020   PROT 6.7 06/05/2020   ALBUMIN 4.2 06/05/2020   CALCIUM 9.1 06/05/2020   Lab Results  Component Value Date   CHOL 223 (H) 06/05/2020   Lab Results  Component Value Date   HDL 50 06/05/2020   Lab Results  Component Value Date   LDLCALC 148 (H) 06/05/2020   Lab Results  Component Value Date   TRIG 141 06/05/2020   Lab Results  Component Value Date   CHOLHDL 4.5 (H) 06/05/2020   Lab Results  Component Value Date   HGBA1C 6.8 07/03/2020

## 2020-10-09 ENCOUNTER — Other Ambulatory Visit: Payer: Self-pay

## 2020-10-09 ENCOUNTER — Encounter: Payer: Self-pay | Admitting: Family Medicine

## 2020-10-09 ENCOUNTER — Ambulatory Visit (INDEPENDENT_AMBULATORY_CARE_PROVIDER_SITE_OTHER): Payer: 59 | Admitting: Family Medicine

## 2020-10-09 VITALS — BP 184/84 | HR 74 | Temp 97.7°F | Ht 70.0 in | Wt 212.8 lb

## 2020-10-09 DIAGNOSIS — I10 Essential (primary) hypertension: Secondary | ICD-10-CM | POA: Diagnosis not present

## 2020-10-09 DIAGNOSIS — E782 Mixed hyperlipidemia: Secondary | ICD-10-CM | POA: Diagnosis not present

## 2020-10-09 DIAGNOSIS — Z23 Encounter for immunization: Secondary | ICD-10-CM

## 2020-10-09 DIAGNOSIS — E1165 Type 2 diabetes mellitus with hyperglycemia: Secondary | ICD-10-CM

## 2020-10-09 DIAGNOSIS — M25561 Pain in right knee: Secondary | ICD-10-CM

## 2020-10-09 DIAGNOSIS — E669 Obesity, unspecified: Secondary | ICD-10-CM | POA: Diagnosis not present

## 2020-10-09 LAB — LIPID PANEL
Chol/HDL Ratio: 3.7 ratio (ref 0.0–4.4)
Cholesterol, Total: 205 mg/dL — ABNORMAL HIGH (ref 100–199)
HDL: 56 mg/dL (ref >39)
LDL Chol Calc (NIH): 133 mg/dL — ABNORMAL HIGH (ref 0–99)
Triglycerides: 92 mg/dL (ref 0–149)
VLDL Cholesterol Cal: 16 mg/dL (ref 5–40)

## 2020-10-09 LAB — CMP14+EGFR
ALT: 18 IU/L (ref 0–32)
AST: 12 IU/L (ref 0–40)
Albumin/Globulin Ratio: 1.8 (ref 1.2–2.2)
Albumin: 4.4 g/dL (ref 3.8–4.9)
Alkaline Phosphatase: 68 IU/L (ref 44–121)
BUN/Creatinine Ratio: 17 (ref 9–23)
BUN: 10 mg/dL (ref 6–24)
Bilirubin Total: 0.4 mg/dL (ref 0.0–1.2)
CO2: 21 mmol/L (ref 20–29)
Calcium: 9.4 mg/dL (ref 8.7–10.2)
Chloride: 106 mmol/L (ref 96–106)
Creatinine, Ser: 0.6 mg/dL (ref 0.57–1.00)
GFR calc Af Amer: 115 mL/min/{1.73_m2} (ref >59)
GFR calc non Af Amer: 100 mL/min/{1.73_m2} (ref >59)
Globulin, Total: 2.4 g/dL (ref 1.5–4.5)
Glucose: 100 mg/dL — ABNORMAL HIGH (ref 65–99)
Potassium: 4.4 mmol/L (ref 3.5–5.2)
Sodium: 144 mmol/L (ref 134–144)
Total Protein: 6.8 g/dL (ref 6.0–8.5)

## 2020-10-09 LAB — BAYER DCA HB A1C WAIVED: HB A1C (BAYER DCA - WAIVED): 6.4 % (ref <7.0)

## 2020-10-09 MED ORDER — HYDROCHLOROTHIAZIDE 12.5 MG PO CAPS: 12.5000 mg | ORAL_CAPSULE | Freq: Every day | ORAL | 2 refills | Status: DC

## 2020-10-09 NOTE — Progress Notes (Signed)
Assessment & Plan:  1. Essential hypertension - Improving. Continue Lisinopril 40 mg once daily. Add HCTZ 12.5 mg once daily. Continue diet and exercise.  - hydrochlorothiazide (MICROZIDE) 12.5 MG capsule; Take 1 capsule (12.5 mg total) by mouth daily.  Dispense: 30 capsule; Refill: 2 - CMP14+EGFR - Lipid panel  2. Type 2 diabetes mellitus with hyperglycemia, without long-term current use of insulin (HCC) Lab Results  Component Value Date   HGBA1C 6.4 10/09/2020   HGBA1C 6.8 07/03/2020  - Diabetes is at goal of A1c < 7. - Medications: none - Patient is not currently taking a statin. Patient is taking an ACE-inhibitor/ARB.  - Last foot exam: 10/09/2020 - Urine Microalbumin/Creat Ratio: 07/31/2020 - Instruction/counseling given: reminded to get eye exam and discussed foot care - CMP14+EGFR - Bayer DCA Hb A1c Waived  3. Mixed hyperlipidemia - Labs to assess. - Lipid panel  4. Obesity (BMI 30.0-34.9) - Continue diet and exercise. Patient has lost 6 lbs in the past month and a half.  5. Recurrent pain of right knee - Doing well since she had an injection.  6. Need for immunization against influenza - Flu Vaccine QUAD 36+ mos IM   Return in about 6 weeks (around 11/20/2020) for HTN.  [name], MSN, APRN, FNP-C [facility]  Subjective:    Patient ID: Kara Mathis, female    DOB: 1961/10/13, 59 y.o.   MRN: [id number]  Patient Care Team: [name], FNP as PCP - General (Family Medicine)   Chief Complaint:  Chief Complaint  Patient presents with  . Hypertension    6 week follow up    HPI: Kara Mathis is a 59 y.o. female presenting on 10/09/2020 for Hypertension (6 week follow up)  Patient is following up on hypertension. At our last visit her Lisinopril was increased from 20 mg to 40 mg once daily. She has been working hard on the [employer]. She does monitor her BP at home and reports she averages 156-158/80s.    Patient received  a knee injection at her last visit and reports her knee is doing great.   Diabetes: Patient presents for follow up of diabetes. Current symptoms include: none. Known diabetic complications: none. Medication compliance: not on medication. Current diet: in general, a "healthy" diet  . Is she  on ACE inhibitor or angiotensin II receptor blocker? Yes. Is she on a statin? No.   Lab Results  Component Value Date   HGBA1C 6.4 10/09/2020   HGBA1C 6.8 07/03/2020   Lab Results  Component Value Date   LDLCALC 133 (H) 10/09/2020   CREATININE 0.60 10/09/2020     New complaints: None  Social history:  Relevant past medical, surgical, family and social history reviewed and updated as indicated. Interim medical history since our last visit reviewed.  Allergies and medications reviewed and updated.  DATA REVIEWED: CHART IN EPIC  ROS: Negative unless specifically indicated above in HPI.    Current Outpatient Medications:  .  lisinopril (ZESTRIL) 40 MG tablet, Take 1 tablet (40 mg total) by mouth daily., Disp: 30 tablet, Rfl: 2   Allergies  Allergen Reactions  . Asa [Aspirin]     Shaky and dizzy    Past Medical History:  Diagnosis Date  . Arthritis   . Hyperlipidemia   . Hypertension   . Type 2 diabetes mellitus ([place]) 07/03/2020   A1c 6.8  . Uterine fibroid   . Vitamin D deficiency     Past Surgical History:  Procedure Laterality Date  . MOUTH SURGERY    . TUBAL LIGATION  07/02/1986    Social History   Socioeconomic History  . Marital status: Widowed    Spouse name: Not on file  . Number of children: Not on file  . Years of education: Not on file  . Highest education level: Not on file  Occupational History  . Not on file  Tobacco Use  . Smoking status: Never Smoker  . Smokeless tobacco: Never Used  Substance and Sexual Activity  . Alcohol use: Never  . Drug use: Never  . Sexual activity: Not on file  Other Topics Concern  . Not on file  Social History Narrative    . Not on file   Social Determinants of Health   Financial Resource Strain:   . Difficulty of Paying Living Expenses: Not on file  Food Insecurity:   . Worried About Charity fundraiser in the Last Year: Not on file  . Ran Out of Food in the Last Year: Not on file  Transportation Needs:   . Lack of Transportation (Medical): Not on file  . Lack of Transportation (Non-Medical): Not on file  Physical Activity:   . Days of Exercise per Week: Not on file  . Minutes of Exercise per Session: Not on file  Stress:   . Feeling of Stress : Not on file  Social Connections:   . Frequency of Communication with Friends and Family: Not on file  . Frequency of Social Gatherings with Friends and Family: Not on file  . Attends Religious Services: Not on file  . Active Member of Clubs or Organizations: Not on file  . Attends Archivist Meetings: Not on file  . Marital Status: Not on file  Intimate Partner Violence:   . Fear of Current or Ex-Partner: Not on file  . Emotionally Abused: Not on file  . Physically Abused: Not on file  . Sexually Abused: Not on file        Objective:    BP (!) 184/84   Pulse 74   Temp 97.7 F (36.5 C) (Temporal)   Ht 5' 10"  (1.778 m)   Wt 212 lb 12.8 oz (96.5 kg)   LMP 03/17/2017   SpO2 100%   BMI 30.53 kg/m   Wt Readings from Last 3 Encounters:  10/09/20 212 lb 12.8 oz (96.5 kg)  08/28/20 218 lb 9.6 oz (99.2 kg)  07/31/20 225 lb 6.4 oz (102.2 kg)    Physical Exam Vitals reviewed.  Constitutional:      General: She is not in acute distress.    Appearance: Normal appearance. She is obese. She is not ill-appearing, toxic-appearing or diaphoretic.  HENT:     Head: Normocephalic and atraumatic.  Eyes:     General: No scleral icterus.       Right eye: No discharge.        Left eye: No discharge.     Conjunctiva/sclera: Conjunctivae normal.  Cardiovascular:     Rate and Rhythm: Normal rate and regular rhythm.     Heart sounds: Normal heart  sounds. No murmur heard.  No friction rub. No gallop.   Pulmonary:     Effort: Pulmonary effort is normal. No respiratory distress.     Breath sounds: Normal breath sounds. No stridor. No wheezing, rhonchi or rales.  Musculoskeletal:        General: Normal range of motion.     Cervical back: Normal range of motion.  Skin:    General: Skin is warm and dry.     Capillary Refill: Capillary refill takes less than 2 seconds.  Neurological:     General: No focal deficit present.     Mental Status: She is alert and oriented to person, place, and time. Mental status is at baseline.  Psychiatric:        Mood and Affect: Mood normal.        Behavior: Behavior normal.        Thought Content: Thought content normal.        Judgment: Judgment normal.     No results found for: TSH No results found for: WBC, HGB, HCT, MCV, PLT Lab Results  Component Value Date   NA 143 06/05/2020   K 4.1 06/05/2020   CO2 25 06/05/2020   GLUCOSE 112 (H) 06/05/2020   BUN 10 06/05/2020   CREATININE 0.55 (L) 06/05/2020   BILITOT 0.4 06/05/2020   ALKPHOS 71 06/05/2020   AST 18 06/05/2020   ALT 26 06/05/2020   PROT 6.7 06/05/2020   ALBUMIN 4.2 06/05/2020   CALCIUM 9.1 06/05/2020   Lab Results  Component Value Date   CHOL 223 (H) 06/05/2020   Lab Results  Component Value Date   HDL 50 06/05/2020   Lab Results  Component Value Date   LDLCALC 148 (H) 06/05/2020   Lab Results  Component Value Date   TRIG 141 06/05/2020   Lab Results  Component Value Date   CHOLHDL 4.5 (H) 06/05/2020   Lab Results  Component Value Date   HGBA1C 6.8 07/03/2020

## 2020-10-11 ENCOUNTER — Other Ambulatory Visit: Payer: Self-pay | Admitting: Family Medicine

## 2020-10-11 DIAGNOSIS — E782 Mixed hyperlipidemia: Secondary | ICD-10-CM

## 2020-10-11 MED ORDER — ATORVASTATIN CALCIUM 10 MG PO TABS: 10.0000 mg | ORAL_TABLET | Freq: Every day | ORAL | 2 refills | Status: DC

## 2020-10-13 ENCOUNTER — Encounter: Payer: Self-pay | Admitting: Family Medicine

## 2020-10-14 MED ORDER — ATORVASTATIN CALCIUM 10 MG PO TABS: 10.0000 mg | ORAL_TABLET | Freq: Every day | ORAL | 2 refills | Status: DC

## 2020-10-14 NOTE — Addendum Note (Signed)
Addended by: Antonietta Barcelona D on: 10/14/2020 09:14 AM   Modules accepted: Orders

## 2020-10-14 NOTE — Progress Notes (Signed)
E-prescribe down. resent 

## 2020-10-28 ENCOUNTER — Other Ambulatory Visit: Payer: Self-pay | Admitting: Family Medicine

## 2020-10-28 DIAGNOSIS — I10 Essential (primary) hypertension: Secondary | ICD-10-CM

## 2020-11-28 ENCOUNTER — Encounter: Payer: Self-pay | Admitting: Nurse Practitioner

## 2020-11-28 ENCOUNTER — Ambulatory Visit (INDEPENDENT_AMBULATORY_CARE_PROVIDER_SITE_OTHER): Payer: 59 | Admitting: Nurse Practitioner

## 2020-11-28 ENCOUNTER — Other Ambulatory Visit: Payer: Self-pay

## 2020-11-28 VITALS — BP 130/83 | HR 79 | Temp 97.7°F | Ht 70.0 in | Wt 202.2 lb

## 2020-11-28 DIAGNOSIS — I10 Essential (primary) hypertension: Secondary | ICD-10-CM | POA: Diagnosis not present

## 2020-11-28 DIAGNOSIS — E782 Mixed hyperlipidemia: Secondary | ICD-10-CM | POA: Diagnosis not present

## 2020-11-28 MED ORDER — LISINOPRIL 40 MG PO TABS: ORAL_TABLET | ORAL | 2 refills | Status: DC

## 2020-11-28 MED ORDER — HYDROCHLOROTHIAZIDE 12.5 MG PO CAPS: 12.5000 mg | ORAL_CAPSULE | Freq: Every day | ORAL | 2 refills | Status: DC

## 2020-11-28 MED ORDER — ATORVASTATIN CALCIUM 10 MG PO TABS: 10.0000 mg | ORAL_TABLET | Freq: Every day | ORAL | 2 refills | Status: DC

## 2020-11-28 NOTE — Assessment & Plan Note (Signed)
Hyperlipidemia well controlled on current medication treatment.  Continue healthy diet and exercise regimen. Follow-up in 3 months.

## 2020-11-28 NOTE — Assessment & Plan Note (Signed)
Hypertension well controlled on current treatment plan.  No changes to current dose.  Advised patient to continue on a healthy diet and exercise regimen as tolerated. Rx refill sent to pharmacy. Follow-up in 3 months.

## 2020-11-28 NOTE — Progress Notes (Signed)
Established Patient Office Visit  Subjective:  Patient ID: Kara Mathis, female    DOB: 08-09-1961  Age: 59 y.o. MRN: [id number]  CC:  Chief Complaint  Patient presents with   Hypertension    HPI Kara Mathis presents for Pt presents for follow up of hypertension. Patient was diagnosed few months ago.  The patient is tolerating the medication well without side effects. Compliance with treatment has been good; including taking medication as directed , maintains a healthy diet and regular exercise regimen , and following up as directed.  Current medication hydrochlorothiazide 12.5 mg tablet by mouth daily, lisinopril 40 mg tablet daily.  Mixed hyperlipidemia  Pt presents with hyperlipidemia. Compliance with treatment has been  good; The patient is compliant with medications, maintains a low cholesterol diet , follows up as directed , and maintains an exercise regimen . The patient denies experiencing any hypercholesterolemia related symptoms.  Current medication atorvastatin 10 mg tablet daily.   Past Medical History:  Diagnosis Date   Arthritis    Hyperlipidemia    Hypertension    Type 2 diabetes mellitus ([place]) 07/03/2020   A1c 6.8   Uterine fibroid    Vitamin D deficiency     Past Surgical History:  Procedure Laterality Date   MOUTH SURGERY     TUBAL LIGATION  07/02/1986    Family History  Problem Relation Age of Onset   Heart attack Father     Social History   Socioeconomic History   Marital status: Widowed    Spouse name: Not on file   Number of children: Not on file   Years of education: Not on file   Highest education level: Not on file  Occupational History   Not on file  Tobacco Use   Smoking status: Never Smoker   Smokeless tobacco: Never Used  Substance and Sexual Activity   Alcohol use: Never   Drug use: Never   Sexual activity: Not on file  Other Topics Concern   Not on file  Social History Narrative   Not on file    Social Determinants of Health   Financial Resource Strain:    Difficulty of Paying Living Expenses: Not on file  Food Insecurity:    Worried About [place] in the Last Year: Not on file   Ran Out of Food in the Last Year: Not on file  Transportation Needs:    Lack of Transportation (Medical): Not on file   Lack of Transportation (Non-Medical): Not on file  Physical Activity:    Days of Exercise per Week: Not on file   Minutes of Exercise per Session: Not on file  Stress:    Feeling of Stress : Not on file  Social Connections:    Frequency of Communication with Friends and Family: Not on file   Frequency of Social Gatherings with Friends and Family: Not on file   Attends Religious Services: Not on file   Active Member of Clubs or Organizations: Not on file   Attends Archivist Meetings: Not on file   Marital Status: Not on file  Intimate Partner Violence:    Fear of Current or Ex-Partner: Not on file   Emotionally Abused: Not on file   Physically Abused: Not on file   Sexually Abused: Not on file    Outpatient Medications Prior to Visit  Medication Sig Dispense Refill   atorvastatin (LIPITOR) 10 MG tablet Take 1 tablet (10 mg total) by mouth daily. 30 tablet 2  hydrochlorothiazide (MICROZIDE) 12.5 MG capsule Take 1 capsule (12.5 mg total) by mouth daily. 30 capsule 2   lisinopril (ZESTRIL) 40 MG tablet TAKE ONE (1) TABLET EACH DAY 30 tablet 0   No facility-administered medications prior to visit.    Allergies  Allergen Reactions   Asa [Aspirin]     Shaky and dizzy     ROS Review of Systems  Neurological: Negative for dizziness, light-headedness and headaches.  All other systems reviewed and are negative.     Objective:    Physical Exam Vitals reviewed.  Constitutional:      Appearance: Normal appearance.  HENT:     Head: Normocephalic.     Nose: Nose normal.  Eyes:     Conjunctiva/sclera: Conjunctivae normal.   Cardiovascular:     Rate and Rhythm: Normal rate and regular rhythm.     Pulses: Normal pulses.     Heart sounds: Normal heart sounds.  Pulmonary:     Effort: Pulmonary effort is normal.     Breath sounds: Normal breath sounds.  Abdominal:     General: Bowel sounds are normal.  Musculoskeletal:        General: Normal range of motion.  Skin:    General: Skin is warm.  Neurological:     Mental Status: She is alert and oriented to person, place, and time.  Psychiatric:        Mood and Affect: Mood normal.        Behavior: Behavior normal.     BP 130/83    Pulse 79    Temp 97.7 F (36.5 C) (Temporal)    Ht 5\' 10"  (1.778 m)    Wt 202 lb 4 oz (91.7 kg)    LMP 03/17/2017    BMI 29.02 kg/m  Wt Readings from Last 3 Encounters:  11/28/20 202 lb 4 oz (91.7 kg)  10/09/20 212 lb 12.8 oz (96.5 kg)  08/28/20 218 lb 9.6 oz (99.2 kg)     Health Maintenance Due  Topic Date Due   FOOT EXAM  Never done   OPHTHALMOLOGY EXAM  Never done   PAP SMEAR-Modifier  Never done   Fecal DNA (Cologuard)  Never done    There are no preventive care reminders to display for this patient.  No results found for: TSH No results found for: WBC, HGB, HCT, MCV, PLT Lab Results  Component Value Date   NA 144 10/09/2020   K 4.4 10/09/2020   CO2 21 10/09/2020   GLUCOSE 100 (H) 10/09/2020   BUN 10 10/09/2020   CREATININE 0.60 10/09/2020   BILITOT 0.4 10/09/2020   ALKPHOS 68 10/09/2020   AST 12 10/09/2020   ALT 18 10/09/2020   PROT 6.8 10/09/2020   ALBUMIN 4.4 10/09/2020   CALCIUM 9.4 10/09/2020   Lab Results  Component Value Date   CHOL 205 (H) 10/09/2020   Lab Results  Component Value Date   HDL 56 10/09/2020   Lab Results  Component Value Date   LDLCALC 133 (H) 10/09/2020   Lab Results  Component Value Date   TRIG 92 10/09/2020   Lab Results  Component Value Date   CHOLHDL 3.7 10/09/2020   Lab Results  Component Value Date   HGBA1C 6.4 10/09/2020      Assessment & Plan:    Problem List Items Addressed This Visit      Cardiovascular and Mediastinum   Essential hypertension - Primary    Hypertension well controlled on current treatment plan.  No  changes to current dose.  Advised patient to continue on a healthy diet and exercise regimen as tolerated. Rx refill sent to pharmacy. Follow-up in 3 months.      Relevant Medications   hydrochlorothiazide (MICROZIDE) 12.5 MG capsule   lisinopril (ZESTRIL) 40 MG tablet   atorvastatin (LIPITOR) 10 MG tablet     Other   Hyperlipidemia    Hyperlipidemia well controlled on current medication treatment.  Continue healthy diet and exercise regimen. Follow-up in 3 months.      Relevant Medications   hydrochlorothiazide (MICROZIDE) 12.5 MG capsule   lisinopril (ZESTRIL) 40 MG tablet   atorvastatin (LIPITOR) 10 MG tablet      Meds ordered this encounter  Medications   hydrochlorothiazide (MICROZIDE) 12.5 MG capsule    Sig: Take 1 capsule (12.5 mg total) by mouth daily.    Dispense:  30 capsule    Refill:  2   lisinopril (ZESTRIL) 40 MG tablet    Sig: TAKE ONE (1) TABLET EACH DAY    Dispense:  30 tablet    Refill:  2   atorvastatin (LIPITOR) 10 MG tablet    Sig: Take 1 tablet (10 mg total) by mouth daily.    Dispense:  30 tablet    Refill:  2    Follow-up: Return in about 3 months (around 02/26/2021).    Ivy Lynn, NP

## 2020-11-28 NOTE — Patient Instructions (Signed)
Cholesterol Content in Foods Cholesterol is a waxy, fat-like substance that helps to carry fat in the blood. The body needs cholesterol in small amounts, but too much cholesterol can cause damage to the arteries and heart. Most people should eat less than 200 milligrams (mg) of cholesterol a day. Foods with cholesterol  Cholesterol is found in animal-based foods, such as meat, seafood, and dairy. Generally, low-fat dairy and lean meats have less cholesterol than full-fat dairy and fatty meats. The milligrams of cholesterol per serving (mg per serving) of common cholesterol-containing foods are listed below. Meat and other proteins  Egg -- one large whole egg has 186 mg.  Veal shank -- 4 oz has 141 mg.  Lean ground turkey (93% lean) -- 4 oz has 118 mg.  Fat-trimmed lamb loin -- 4 oz has 106 mg.  Lean ground beef (90% lean) -- 4 oz has 100 mg.  Lobster -- 3.5 oz has 90 mg.  Pork loin chops -- 4 oz has 86 mg.  Canned salmon -- 3.5 oz has 83 mg.  Fat-trimmed beef top loin -- 4 oz has 78 mg.  Frankfurter -- 1 frank (3.5 oz) has 77 mg.  Crab -- 3.5 oz has 71 mg.  Roasted chicken without skin, white meat -- 4 oz has 66 mg.  Light bologna -- 2 oz has 45 mg.  Deli-cut turkey -- 2 oz has 31 mg.  Canned tuna -- 3.5 oz has 31 mg.  Bacon -- 1 oz has 29 mg.  Oysters and mussels (raw) -- 3.5 oz has 25 mg.  Mackerel -- 1 oz has 22 mg.  Trout -- 1 oz has 20 mg.  Pork sausage -- 1 link (1 oz) has 17 mg.  Salmon -- 1 oz has 16 mg.  Tilapia -- 1 oz has 14 mg. Dairy  Soft-serve ice cream --  cup (4 oz) has 103 mg.  Whole-milk yogurt -- 1 cup (8 oz) has 29 mg.  Cheddar cheese -- 1 oz has 28 mg.  American cheese -- 1 oz has 28 mg.  Whole milk -- 1 cup (8 oz) has 23 mg.  2% milk -- 1 cup (8 oz) has 18 mg.  Cream cheese -- 1 tablespoon (Tbsp) has 15 mg.  Cottage cheese --  cup (4 oz) has 14 mg.  Low-fat (1%) milk -- 1 cup (8 oz) has 10 mg.  Sour cream -- 1 Tbsp has 8.5  mg.  Low-fat yogurt -- 1 cup (8 oz) has 8 mg.  Nonfat Greek yogurt -- 1 cup (8 oz) has 7 mg.  Half-and-half cream -- 1 Tbsp has 5 mg. Fats and oils  Cod liver oil -- 1 tablespoon (Tbsp) has 82 mg.  Butter -- 1 Tbsp has 15 mg.  Lard -- 1 Tbsp has 14 mg.  Bacon grease -- 1 Tbsp has 14 mg.  Mayonnaise -- 1 Tbsp has 5-10 mg.  Margarine -- 1 Tbsp has 3-10 mg. Exact amounts of cholesterol in these foods may vary depending on specific ingredients and brands. Foods without cholesterol Most plant-based foods do not have cholesterol unless you combine them with a food that has cholesterol. Foods without cholesterol include:  Grains and cereals.  Vegetables.  Fruits.  Vegetable oils, such as olive, canola, and sunflower oil.  Legumes, such as peas, beans, and lentils.  Nuts and seeds.  Egg whites. Summary  The body needs cholesterol in small amounts, but too much cholesterol can cause damage to the arteries and heart.    Most people should eat less than 200 milligrams (mg) of cholesterol a day. This information is not intended to replace advice given to you by your health care provider. Make sure you discuss any questions you have with your health care provider. Document Revised: 11/26/2017 Document Reviewed: 08/10/2017 Elsevier Patient Education  [employer]. Hypertension, Adult Hypertension is another name for high blood pressure. High blood pressure forces your heart to work harder to pump blood. This can cause problems over time. There are two numbers in a blood pressure reading. There is a top number (systolic) over a bottom number (diastolic). It is best to have a blood pressure that is below 120/80. Healthy choices can help lower your blood pressure, or you may need medicine to help lower it. What are the causes? The cause of this condition is not known. Some conditions may be related to high blood pressure. What increases the risk?  Smoking.  Having type 2  diabetes mellitus, high cholesterol, or both.  Not getting enough exercise or physical activity.  Being overweight.  Having too much fat, sugar, calories, or salt (sodium) in your diet.  Drinking too much alcohol.  Having long-term (chronic) kidney disease.  Having a family history of high blood pressure.  Age. Risk increases with age.  Race. You may be at higher risk if you are African American.  Gender. Men are at higher risk than women before age 28. After age 79, women are at higher risk than men.  Having obstructive sleep apnea.  Stress. What are the signs or symptoms?  High blood pressure may not cause symptoms. Very high blood pressure (hypertensive crisis) may cause: ? Headache. ? Feelings of worry or nervousness (anxiety). ? Shortness of breath. ? Nosebleed. ? A feeling of being sick to your stomach (nausea). ? Throwing up (vomiting). ? Changes in how you see. ? Very bad chest pain. ? Seizures. How is this treated?  This condition is treated by making healthy lifestyle changes, such as: ? Eating healthy foods. ? Exercising more. ? Drinking less alcohol.  Your health care provider may prescribe medicine if lifestyle changes are not enough to get your blood pressure under control, and if: ? Your top number is above 130. ? Your bottom number is above 80.  Your personal target blood pressure may vary. Follow these instructions at home: Eating and drinking   If told, follow the DASH eating plan. To follow this plan: ? Fill one half of your plate at each meal with fruits and vegetables. ? Fill one fourth of your plate at each meal with whole grains. Whole grains include whole-wheat pasta, brown rice, and whole-grain bread. ? Eat or drink low-fat dairy products, such as skim milk or low-fat yogurt. ? Fill one fourth of your plate at each meal with low-fat (lean) proteins. Low-fat proteins include fish, chicken without skin, eggs, beans, and tofu. ? Avoid  fatty meat, cured and processed meat, or chicken with skin. ? Avoid pre-made or processed food.  Eat less than 1,500 mg of salt each day.  Do not drink alcohol if: ? Your doctor tells you not to drink. ? You are pregnant, may be pregnant, or are planning to become pregnant.  If you drink alcohol: ? Limit how much you use to:  0-1 drink a day for women.  0-2 drinks a day for men. ? Be aware of how much alcohol is in your drink. In the [place] one drink equals one 12 oz bottle of beer (  355 mL), one 5 oz glass of wine (148 mL), or one 1 oz glass of hard liquor (44 mL). Lifestyle   Work with your doctor to stay at a healthy weight or to lose weight. Ask your doctor what the best weight is for you.  Get at least 30 minutes of exercise most days of the week. This may include walking, swimming, or biking.  Get at least 30 minutes of exercise that strengthens your muscles (resistance exercise) at least 3 days a week. This may include lifting weights or doing Pilates.  Do not use any products that contain nicotine or tobacco, such as cigarettes, e-cigarettes, and chewing tobacco. If you need help quitting, ask your doctor.  Check your blood pressure at home as told by your doctor.  Keep all follow-up visits as told by your doctor. This is important. Medicines  Take over-the-counter and prescription medicines only as told by your doctor. Follow directions carefully.  Do not skip doses of blood pressure medicine. The medicine does not work as well if you skip doses. Skipping doses also puts you at risk for problems.  Ask your doctor about side effects or reactions to medicines that you should watch for. Contact a doctor if you:  Think you are having a reaction to the medicine you are taking.  Have headaches that keep coming back (recurring).  Feel dizzy.  Have swelling in your ankles.  Have trouble with your vision. Get help right away if you:  Get a very bad headache.  Start  to feel mixed up (confused).  Feel weak or numb.  Feel faint.  Have very bad pain in your: ? Chest. ? Belly (abdomen).  Throw up more than once.  Have trouble breathing. Summary  Hypertension is another name for high blood pressure.  High blood pressure forces your heart to work harder to pump blood.  For most people, a normal blood pressure is less than 120/80.  Making healthy choices can help lower blood pressure. If your blood pressure does not get lower with healthy choices, you may need to take medicine. This information is not intended to replace advice given to you by your health care provider. Make sure you discuss any questions you have with your health care provider. Document Revised: 08/24/2018 Document Reviewed: 08/24/2018 Elsevier Patient Education  2020 [employer].

## 2021-03-06 ENCOUNTER — Encounter: Payer: Self-pay | Admitting: Family Medicine

## 2021-03-06 ENCOUNTER — Ambulatory Visit (INDEPENDENT_AMBULATORY_CARE_PROVIDER_SITE_OTHER): Payer: 59 | Admitting: Family Medicine

## 2021-03-06 ENCOUNTER — Other Ambulatory Visit: Payer: Self-pay

## 2021-03-06 VITALS — BP 114/74 | HR 71 | Temp 97.8°F | Ht 70.0 in | Wt 198.0 lb

## 2021-03-06 DIAGNOSIS — E1165 Type 2 diabetes mellitus with hyperglycemia: Secondary | ICD-10-CM | POA: Diagnosis not present

## 2021-03-06 DIAGNOSIS — E782 Mixed hyperlipidemia: Secondary | ICD-10-CM

## 2021-03-06 DIAGNOSIS — M1990 Unspecified osteoarthritis, unspecified site: Secondary | ICD-10-CM | POA: Diagnosis not present

## 2021-03-06 DIAGNOSIS — I10 Essential (primary) hypertension: Secondary | ICD-10-CM

## 2021-03-06 DIAGNOSIS — E559 Vitamin D deficiency, unspecified: Secondary | ICD-10-CM | POA: Insufficient documentation

## 2021-03-06 DIAGNOSIS — Z1211 Encounter for screening for malignant neoplasm of colon: Secondary | ICD-10-CM

## 2021-03-06 LAB — BAYER DCA HB A1C WAIVED: HB A1C (BAYER DCA - WAIVED): 6.5 % (ref <7.0)

## 2021-03-06 MED ORDER — HYDROCHLOROTHIAZIDE 12.5 MG PO CAPS: 12.5000 mg | ORAL_CAPSULE | Freq: Every day | ORAL | 1 refills | Status: DC

## 2021-03-06 MED ORDER — LISINOPRIL 40 MG PO TABS: 40.0000 mg | ORAL_TABLET | Freq: Every day | ORAL | 1 refills | Status: DC

## 2021-03-06 MED ORDER — ATORVASTATIN CALCIUM 10 MG PO TABS: 10.0000 mg | ORAL_TABLET | Freq: Every day | ORAL | 1 refills | Status: DC

## 2021-03-06 MED ORDER — DICLOFENAC SODIUM 75 MG PO TBEC: 75.0000 mg | DELAYED_RELEASE_TABLET | Freq: Two times a day (BID) | ORAL | 1 refills | Status: DC

## 2021-03-06 NOTE — Progress Notes (Signed)
Assessment & Plan:  1. Type 2 diabetes mellitus with hyperglycemia, without long-term current use of insulin (HCC) Lab Results  Component Value Date   HGBA1C 6.5 03/06/2021   HGBA1C 6.4 10/09/2020   HGBA1C 6.8 07/03/2020    - Diabetes is at goal of A1c < 7. - Medications: not on medication - diet controlled - Patient is currently taking a statin. Patient is taking an ACE-inhibitor/ARB.  - Instruction/counseling given: discussed foot care and discussed diet  Diabetes Health Maintenance Due  Topic Date Due  . OPHTHALMOLOGY EXAM  Never done  . HEMOGLOBIN A1C  09/06/2021  . FOOT EXAM  03/06/2022    Lab Results  Component Value Date   LABMICR 4.8 07/31/2020   - Bayer DCA Hb A1c Waived - atorvastatin (LIPITOR) 10 MG tablet; Take 1 tablet (10 mg total) by mouth daily.  Dispense: 90 tablet; Refill: 1  2. Essential hypertension Well controlled on current regimen.  - CBC with Differential/Platelet - CMP14+EGFR - hydrochlorothiazide (MICROZIDE) 12.5 MG capsule; Take 1 capsule (12.5 mg total) by mouth daily.  Dispense: 90 capsule; Refill: 1 - lisinopril (ZESTRIL) 40 MG tablet; Take 1 tablet (40 mg total) by mouth daily.  Dispense: 90 tablet; Refill: 1  3. Mixed hyperlipidemia Previously uncontrolled and started on atorvastatin.  Labs today to follow-up. - Lipid panel - atorvastatin (LIPITOR) 10 MG tablet; Take 1 tablet (10 mg total) by mouth daily.  Dispense: 90 tablet; Refill: 1  4. Arthritis Rx'd diclofenac.  Reassured that we monitor her kidney and liver function every time we do her lab work. - diclofenac (VOLTAREN) 75 MG EC tablet; Take 1 tablet (75 mg total) by mouth 2 (two) times daily.  Dispense: 180 tablet; Refill: 1  5. Vitamin D deficiency Labs to assess. - Vitamin D level  6. Colon cancer screening - Ambulatory referral to Gastroenterology   Return in about 3 months (around 06/06/2021) for annual physical w. pap smear.  [name], MSN, APRN, FNP-C [facility]  Subjective:    Patient ID: Kara Mathis, female    DOB: [birth year], 60 y.o.   MRN: [id number]  Patient Care Team: [name], FNP as PCP - General (Family Medicine)   Chief Complaint:  Chief Complaint  Patient presents with  . Diabetes  . Hypertension    Check up of chronic medical conditions     HPI: Kara Mathis is a 60 y.o. female presenting on 03/06/2021 for Diabetes and Hypertension (Check up of chronic medical conditions )  Diabetes: Patient presents for follow up of diabetes. Current symptoms include: none. Known diabetic complications: none. Medication compliance: diet controlled. Current diet: in general, a "healthy" diet  . Patient reports she is not eating any fried or sweet foods. She cut out soda and is drinking a lot of water. Current exercise: none. Home blood sugar records: patient does not check sugars. Is she  on ACE inhibitor or angiotensin II receptor blocker? Yes. Is she on a statin? Yes.    New complaints: Patient reports pain in the top of her right foot that has been ongoing. She was taking Motrin which was helpful but stopped taking it as she was afraid she was doing damage to her kidneys. She currently takes Tylenol 500 mg QHS. A roll on pain relief helps the most. She does have a history of a fracture in this foot.   Social history:  Relevant past medical, surgical, family and social history reviewed and updated as indicated. Interim  medical history since our last visit reviewed.  Allergies and medications reviewed and updated.  DATA REVIEWED: CHART IN EPIC  ROS: Negative unless specifically indicated above in HPI.    Current Outpatient Medications:  .  atorvastatin (LIPITOR) 10 MG tablet, Take 1 tablet (10 mg total) by mouth daily., Disp: 30 tablet, Rfl: 2 .  hydrochlorothiazide (MICROZIDE) 12.5 MG capsule, Take 1 capsule (12.5 mg total) by mouth daily., Disp: 30 capsule, Rfl: 2 .  lisinopril (ZESTRIL) 40 MG tablet,  TAKE ONE (1) TABLET EACH DAY, Disp: 30 tablet, Rfl: 2   Allergies  Allergen Reactions  . Asa [Aspirin]     Shaky and dizzy    Past Medical History:  Diagnosis Date  . Arthritis   . Hyperlipidemia   . Hypertension   . Type 2 diabetes mellitus ([place]) 07/03/2020   A1c 6.8  . Uterine fibroid   . Vitamin D deficiency     Past Surgical History:  Procedure Laterality Date  . MOUTH SURGERY    . TUBAL LIGATION  07/02/1986    Social History   Socioeconomic History  . Marital status: Widowed    Spouse name: Not on file  . Number of children: Not on file  . Years of education: Not on file  . Highest education level: Not on file  Occupational History  . Not on file  Tobacco Use  . Smoking status: Never Smoker  . Smokeless tobacco: Never Used  Substance and Sexual Activity  . Alcohol use: Never  . Drug use: Never  . Sexual activity: Not on file  Other Topics Concern  . Not on file  Social History Narrative  . Not on file   Social Determinants of Health   Financial Resource Strain: Not on file  Food Insecurity: Not on file  Transportation Needs: Not on file  Physical Activity: Not on file  Stress: Not on file  Social Connections: Not on file  Intimate Partner Violence: Not on file        Objective:    BP 114/74   Pulse 71   Temp 97.8 F (36.6 C) (Temporal)   Ht 5' 10"  (1.778 m)   Wt 198 lb (89.8 kg)   LMP 03/17/2017   SpO2 100%   BMI 28.41 kg/m   Wt Readings from Last 3 Encounters:  03/06/21 198 lb (89.8 kg)  11/28/20 202 lb 4 oz (91.7 kg)  10/09/20 212 lb 12.8 oz (96.5 kg)    Physical Exam Vitals reviewed.  Constitutional:      General: She is not in acute distress.    Appearance: Normal appearance. She is overweight. She is not ill-appearing, toxic-appearing or diaphoretic.  HENT:     Head: Normocephalic and atraumatic.  Eyes:     General: No scleral icterus.       Right eye: No discharge.        Left eye: No discharge.     Conjunctiva/sclera:  Conjunctivae normal.  Cardiovascular:     Rate and Rhythm: Normal rate and regular rhythm.     Heart sounds: Normal heart sounds. No murmur heard. No friction rub. No gallop.   Pulmonary:     Effort: Pulmonary effort is normal. No respiratory distress.     Breath sounds: Normal breath sounds. No stridor. No wheezing, rhonchi or rales.  Musculoskeletal:        General: Normal range of motion.     Cervical back: Normal range of motion.  Skin:    General: Skin  is warm and dry.     Capillary Refill: Capillary refill takes less than 2 seconds.  Neurological:     General: No focal deficit present.     Mental Status: She is alert and oriented to person, place, and time. Mental status is at baseline.     Gait: Gait abnormal (ambulates with cane).  Psychiatric:        Mood and Affect: Mood normal.        Behavior: Behavior normal.        Thought Content: Thought content normal.        Judgment: Judgment normal.    Diabetic Foot Exam - Simple   Simple Foot Form Diabetic Foot exam was performed with the following findings: Yes 03/06/2021  8:45 AM  Visual Inspection No deformities, no ulcerations, no other skin breakdown bilaterally: Yes Sensation Testing Intact to touch and monofilament testing bilaterally: Yes Pulse Check Posterior Tibialis and Dorsalis pulse intact bilaterally: Yes Comments Thick toenails.     No results found for: TSH No results found for: WBC, HGB, HCT, MCV, PLT Lab Results  Component Value Date   NA 144 10/09/2020   K 4.4 10/09/2020   CO2 21 10/09/2020   GLUCOSE 100 (H) 10/09/2020   BUN 10 10/09/2020   CREATININE 0.60 10/09/2020   BILITOT 0.4 10/09/2020   ALKPHOS 68 10/09/2020   AST 12 10/09/2020   ALT 18 10/09/2020   PROT 6.8 10/09/2020   ALBUMIN 4.4 10/09/2020   CALCIUM 9.4 10/09/2020   Lab Results  Component Value Date   CHOL 205 (H) 10/09/2020   Lab Results  Component Value Date   HDL 56 10/09/2020   Lab Results  Component Value Date    LDLCALC 133 (H) 10/09/2020   Lab Results  Component Value Date   TRIG 92 10/09/2020   Lab Results  Component Value Date   CHOLHDL 3.7 10/09/2020   Lab Results  Component Value Date   HGBA1C 6.4 10/09/2020

## 2021-03-07 LAB — CBC WITH DIFFERENTIAL/PLATELET
Basophils Absolute: 0 10*3/uL (ref 0.0–0.2)
Basos: 0 %
EOS (ABSOLUTE): 0.1 10*3/uL (ref 0.0–0.4)
Eos: 1 %
Hematocrit: 44.4 % (ref 34.0–46.6)
Hemoglobin: 14.3 g/dL (ref 11.1–15.9)
Immature Grans (Abs): 0 10*3/uL (ref 0.0–0.1)
Immature Granulocytes: 0 %
Lymphocytes Absolute: 2.9 10*3/uL (ref 0.7–3.1)
Lymphs: 42 %
MCH: 27.6 pg (ref 26.6–33.0)
MCHC: 32.2 g/dL (ref 31.5–35.7)
MCV: 86 fL (ref 79–97)
Monocytes Absolute: 0.5 10*3/uL (ref 0.1–0.9)
Monocytes: 7 %
Neutrophils Absolute: 3.5 10*3/uL (ref 1.4–7.0)
Neutrophils: 50 %
Platelets: 220 10*3/uL (ref 150–450)
RBC: 5.18 x10E6/uL (ref 3.77–5.28)
RDW: 12.5 % (ref 11.7–15.4)
WBC: 7 10*3/uL (ref 3.4–10.8)

## 2021-03-07 LAB — CMP14+EGFR
ALT: 16 IU/L (ref 0–32)
AST: 9 IU/L (ref 0–40)
Albumin/Globulin Ratio: 1.8 (ref 1.2–2.2)
Albumin: 4.4 g/dL (ref 3.8–4.9)
Alkaline Phosphatase: 65 IU/L (ref 44–121)
BUN/Creatinine Ratio: 15 (ref 12–28)
BUN: 12 mg/dL (ref 8–27)
Bilirubin Total: 0.3 mg/dL (ref 0.0–1.2)
CO2: 26 mmol/L (ref 20–29)
Calcium: 9.1 mg/dL (ref 8.7–10.3)
Chloride: 102 mmol/L (ref 96–106)
Creatinine, Ser: 0.78 mg/dL (ref 0.57–1.00)
Globulin, Total: 2.4 g/dL (ref 1.5–4.5)
Glucose: 116 mg/dL — ABNORMAL HIGH (ref 65–99)
Potassium: 4 mmol/L (ref 3.5–5.2)
Sodium: 142 mmol/L (ref 134–144)
Total Protein: 6.8 g/dL (ref 6.0–8.5)
eGFR: 87 mL/min/{1.73_m2} (ref >59)

## 2021-03-07 LAB — LIPID PANEL
Chol/HDL Ratio: 2.9 ratio (ref 0.0–4.4)
Cholesterol, Total: 149 mg/dL (ref 100–199)
HDL: 51 mg/dL (ref >39)
LDL Chol Calc (NIH): 76 mg/dL (ref 0–99)
Triglycerides: 125 mg/dL (ref 0–149)
VLDL Cholesterol Cal: 22 mg/dL (ref 5–40)

## 2021-03-10 LAB — VITAMIN D 25 HYDROXY (VIT D DEFICIENCY, FRACTURES): Vit D, 25-Hydroxy: 18 ng/mL — ABNORMAL LOW (ref 30.0–100.0)

## 2021-03-10 LAB — SPECIMEN STATUS REPORT

## 2021-03-11 ENCOUNTER — Other Ambulatory Visit: Payer: Self-pay | Admitting: Family Medicine

## 2021-03-11 ENCOUNTER — Encounter: Payer: Self-pay | Admitting: Internal Medicine

## 2021-03-11 DIAGNOSIS — E559 Vitamin D deficiency, unspecified: Secondary | ICD-10-CM

## 2021-03-11 MED ORDER — VITAMIN D (ERGOCALCIFEROL) 1.25 MG (50000 UNIT) PO CAPS
50000.0000 [IU] | ORAL_CAPSULE | ORAL | 1 refills | Status: DC
Start: 2021-03-11 — End: 2021-06-04

## 2021-03-20 ENCOUNTER — Encounter: Payer: Self-pay | Admitting: *Deleted

## 2021-04-02 ENCOUNTER — Ambulatory Visit (INDEPENDENT_AMBULATORY_CARE_PROVIDER_SITE_OTHER): Payer: 59 | Admitting: *Deleted

## 2021-04-02 ENCOUNTER — Other Ambulatory Visit: Payer: Self-pay

## 2021-04-02 VITALS — Ht 70.0 in | Wt 202.0 lb

## 2021-04-02 DIAGNOSIS — Z1211 Encounter for screening for malignant neoplasm of colon: Secondary | ICD-10-CM

## 2021-04-02 MED ORDER — CLENPIQ 10-3.5-12 MG-GM -GM/160ML PO SOLN: 1.0000 | Freq: Once | ORAL | 0 refills | Status: AC

## 2021-04-02 NOTE — Progress Notes (Addendum)
Gastroenterology Pre-Procedure Review  Request Date: 04/02/2021 Requesting Physician: Delight Ovens, NP @ [facility], no previous TCS  PATIENT REVIEW QUESTIONS: The patient responded to the following health history questions as indicated:    1. Diabetes Melitis: no, borderline but managing with diet 2. Joint replacements in the past 12 months: no 3. Major health problems in the past 3 months: no 4. Has an artificial valve or MVP: no 5. Has a defibrillator: no 6. Has been advised in past to take antibiotics in advance of a procedure like teeth cleaning: no 7. Family history of colon cancer: no  8. Alcohol Use: no 9. Illicit drug Use: no 10. History of sleep apnea: no  11. History of coronary artery or other vascular stents placed within the last 12 months: no 12. History of any prior anesthesia complications: no 13. Body mass index is 28.98 kg/m.    MEDICATIONS & ALLERGIES:    Patient reports the following regarding taking any blood thinners:   Plavix? no Aspirin? no Coumadin? no Brilinta? no Xarelto? no Eliquis? no Pradaxa? no Savaysa? no Effient? no  Patient confirms/reports the following medications:  Current Outpatient Medications  Medication Sig Dispense Refill  . atorvastatin (LIPITOR) 10 MG tablet Take 1 tablet (10 mg total) by mouth daily. 90 tablet 1  . diclofenac (VOLTAREN) 75 MG EC tablet Take 1 tablet (75 mg total) by mouth 2 (two) times daily. 180 tablet 1  . hydrochlorothiazide (MICROZIDE) 12.5 MG capsule Take 1 capsule (12.5 mg total) by mouth daily. 90 capsule 1  . lisinopril (ZESTRIL) 40 MG tablet Take 1 tablet (40 mg total) by mouth daily. 90 tablet 1  . Vitamin D, Ergocalciferol, (DRISDOL) 1.25 MG (50000 UNIT) CAPS capsule Take 1 capsule (50,000 Units total) by mouth every 7 (seven) days. 12 capsule 1   No current facility-administered medications for this visit.    Patient confirms/reports the following allergies:  Allergies  Allergen Reactions  . Asa  [Aspirin]     Shaky and dizzy   . Aleve [Naproxen] Palpitations    No orders of the defined types were placed in this encounter.   AUTHORIZATION INFORMATION Primary Insurance: Unknown Jim #: [id number] Pre-Cert / Josem Kaufmann required: No, not required  SCHEDULE INFORMATION: Procedure has been scheduled as follows:  Date: 05/02/2021, Time: PM procedure Location: [facility] with Dr. Abbey Chatters  This Gastroenterology Pre-Precedure Review Form is being routed to the following provider(s): Aliene Altes, PA

## 2021-04-02 NOTE — Progress Notes (Signed)
Ok to schedule.  ASA II 

## 2021-04-02 NOTE — Patient Instructions (Addendum)
Covid test: 04/30/2021 at 10:35 AM.  [place]   Patient Name:  Kara Mathis Date of procedure: 05/02/2021 Time to register at [place] Stay:  You will receive a call from the hospital a few days before your procedure. Provider:  Dr. Abbey Chatters  Please notify us immediately if you are diabetic, take iron supplements, or if you are on coumadin or any blood thinners.  Please hold the following medications: n/a  Note: Do NOT refrigerate or freeze CLENPIQ. CLENPIQ is ready to drink. There is no need to add any other liquid or mix the medicine in the bottle before you start dosing.   05/01/2021- 1 Day prior to procedure:     CLEAR LIQUIDS ALL DAY--NO SOLID FOODS OR DAIRY PRODUCTS! See list of liquids that are allowed and items that are NOT allowed below.   Diabetic Medication Instructions:  n/a   You must drink plenty of CLEAR LIQUIDS starting before your bowel prep. It is important to stay adequately hydrated before, during, and after your bowel prep for the prep to work effectively!   At 4:00 PM Begin the prep as follows:    1. Drink one bottle of pre-mixed CLENPIQ right from the bottle.  2. Drink at least five (5) 8-ounce drinks of clear liquids of your choice within the next 5 hours   At 10:00 PM: 1. Drink the second bottle of pre-mixed CLENPIQ right from the bottle.   2. Drink at least three (3) 8-ounce drinks of clear liquids of your choice within the next 3 hours before going to bed.   Continue clear liquids until 4 hours before your scheduled procedure time.  Nothing by mouth 4 hours before your procedure starts.    05/02/2021-  Day of Procedure:   Diabetic medications adjustments: n/a   You may take TYLENOL products.  Please continue your regular medications unless we have instructed you otherwise.     Please note, on the day of your procedure you MUST be accompanied by an adult who is willing to assume responsibility for you at time of discharge. If you  do not have such person with you, your procedure will have to be rescheduled.                                                                                                                     Please leave ALL jewelry at home prior to coming to the hospital for your procedure.   *It is your responsibility to check with your insurance company for the benefits of coverage you have for this procedure. Unfortunately, not all insurance companies have benefits to cover all or part of these types of procedures. It is your responsibility to check your benefits, however we will be glad to assist you with any codes your insurance company may need.   Please note that most insurance companies will not cover a screening colonoscopy for people under the age of 53  For example, with some insurance companies you may have  benefits for a screening colonoscopy, but if polyps are found the diagnosis will change and then you may have a deductible that will need to be met. Please make sure you check your benefits for screening colonoscopy as well as a diagnostic colonoscopy.    CLEAR LIQUIDS: (NO RED or PURPLE) Water  Jello   Apple Juice  White Grape Juice   Kool-Aid Soft drinks  Banana popsicles Sports Drink  Black coffee (No cream or milk) Tea (No cream or milk)  Broth (fat free beef/chicken/vegetable)  Clear liquids allow you to see your fingers on the other side of the glass.  Be sure they are NOT RED or PURPLE in color, cloudy, but CLEAR.  Do Not Eat: Dairy products of any kind Cranberry juice Tomato or V8 Juice  Orange Juice   Grapefruit Juice Red Grape Juice Alcohol   Non-dairy creamer Solid foods like cereal, oatmeal, yogurt, fruits, vegetables, creamed soups, eggs, bread, etc   HELPFUL HINTS TO MAKE DRINKING EASIER: -Trying drinking through a straw. -If you become nauseated, try consuming smaller amounts or stretch out the time between glasses.  Stop for 30 minutes & slowly start back  drinking.  Call our office with any questions or concerns at [phone].  Thank You,  Christ Kick, [place]

## 2021-04-03 ENCOUNTER — Other Ambulatory Visit: Payer: Self-pay | Admitting: *Deleted

## 2021-04-29 ENCOUNTER — Telehealth: Payer: Self-pay | Admitting: Internal Medicine

## 2021-04-29 ENCOUNTER — Telehealth: Payer: Self-pay | Admitting: *Deleted

## 2021-04-29 NOTE — Telephone Encounter (Signed)
Received call from endo unable to reach patient regarding arrival time for procedure on Friday. She needs to arrive at 11am

## 2021-04-29 NOTE — Telephone Encounter (Signed)
Spoke with pt.  She said that she spoke to someone about the amount that she will owe.  Informed her that she could set up a payment plan with [facility].  Pt voiced understanding.

## 2021-04-29 NOTE — Telephone Encounter (Signed)
Please call patient about her insurance,  She has a question about how much they are gong to pay

## 2021-04-29 NOTE — Telephone Encounter (Signed)
Spoke to pt.  She said that she is aware.  She said that she spoke to Day [facility] earlier.

## 2021-04-30 ENCOUNTER — Other Ambulatory Visit (HOSPITAL_COMMUNITY)
Admission: RE | Admit: 2021-04-30 | Discharge: 2021-04-30 | Disposition: A | Discharge status: Home / Self Care | Payer: 59 | Source: Ambulatory Visit | Attending: Internal Medicine | Admitting: Internal Medicine

## 2021-04-30 ENCOUNTER — Other Ambulatory Visit: Payer: Self-pay

## 2021-04-30 DIAGNOSIS — Z01812 Encounter for preprocedural laboratory examination: Secondary | ICD-10-CM | POA: Diagnosis not present

## 2021-04-30 DIAGNOSIS — Z20822 Contact with and (suspected) exposure to covid-19: Secondary | ICD-10-CM | POA: Diagnosis not present

## 2021-04-30 LAB — BASIC METABOLIC PANEL
Anion gap: 6 (ref 5–15)
BUN: 16 mg/dL (ref 6–20)
CO2: 28 mmol/L (ref 22–32)
Calcium: 9.2 mg/dL (ref 8.9–10.3)
Chloride: 105 mmol/L (ref 98–111)
Creatinine, Ser: 0.67 mg/dL (ref 0.44–1.00)
GFR, Estimated: >60 mL/min (ref >60)
Glucose, Bld: 102 mg/dL — ABNORMAL HIGH (ref 70–99)
Potassium: 4.3 mmol/L (ref 3.5–5.1)
Sodium: 139 mmol/L (ref 135–145)

## 2021-04-30 LAB — SARS CORONAVIRUS 2 (TAT 6-24 HRS): SARS Coronavirus 2: NEGATIVE

## 2021-05-02 ENCOUNTER — Ambulatory Visit (HOSPITAL_COMMUNITY): Payer: 59 | Admitting: Anesthesiology

## 2021-05-02 ENCOUNTER — Encounter (HOSPITAL_COMMUNITY)
Admission: RE | Disposition: A | Discharge status: Home / Self Care | Payer: Self-pay | Source: Home / Self Care | Attending: Internal Medicine

## 2021-05-02 ENCOUNTER — Ambulatory Visit (HOSPITAL_COMMUNITY)
Admission: RE | Admit: 2021-05-02 | Discharge: 2021-05-02 | Disposition: A | Discharge status: Home / Self Care | Payer: 59 | Attending: Internal Medicine | Admitting: Internal Medicine

## 2021-05-02 ENCOUNTER — Encounter (HOSPITAL_COMMUNITY): Payer: Self-pay

## 2021-05-02 ENCOUNTER — Other Ambulatory Visit: Payer: Self-pay

## 2021-05-02 DIAGNOSIS — Z79899 Other long term (current) drug therapy: Secondary | ICD-10-CM | POA: Insufficient documentation

## 2021-05-02 DIAGNOSIS — K5730 Diverticulosis of large intestine without perforation or abscess without bleeding: Secondary | ICD-10-CM | POA: Insufficient documentation

## 2021-05-02 DIAGNOSIS — K648 Other hemorrhoids: Secondary | ICD-10-CM | POA: Diagnosis not present

## 2021-05-02 DIAGNOSIS — K635 Polyp of colon: Secondary | ICD-10-CM | POA: Diagnosis not present

## 2021-05-02 DIAGNOSIS — Z886 Allergy status to analgesic agent status: Secondary | ICD-10-CM | POA: Insufficient documentation

## 2021-05-02 DIAGNOSIS — Z1211 Encounter for screening for malignant neoplasm of colon: Secondary | ICD-10-CM

## 2021-05-02 HISTORY — PX: COLONOSCOPY WITH PROPOFOL: SHX5780

## 2021-05-02 HISTORY — PX: POLYPECTOMY: SHX5525

## 2021-05-02 SURGERY — COLONOSCOPY WITH PROPOFOL
Anesthesia: General

## 2021-05-02 MED ORDER — LACTATED RINGERS IV SOLN: INTRAVENOUS | Status: DC

## 2021-05-02 MED ORDER — CHLORHEXIDINE GLUCONATE CLOTH 2 % EX PADS: 6.0000 | MEDICATED_PAD | Freq: Once | CUTANEOUS | Status: DC

## 2021-05-02 MED ORDER — PROPOFOL 10 MG/ML IV BOLUS
INTRAVENOUS | Status: DC | PRN
  Administered 2021-05-02: 50 mg via INTRAVENOUS
  Administered 2021-05-02: 100 mg via INTRAVENOUS
  Administered 2021-05-02 (×5): 50 mg via INTRAVENOUS

## 2021-05-02 MED ORDER — LIDOCAINE HCL (CARDIAC) PF 100 MG/5ML IV SOSY
PREFILLED_SYRINGE | INTRAVENOUS | Status: DC | PRN
  Administered 2021-05-02: 50 mg via INTRAVENOUS

## 2021-05-02 NOTE — Transfer of Care (Signed)
Immediate Anesthesia Transfer of Care Note  Patient: Kara Mathis  Procedure(s) Performed: COLONOSCOPY WITH PROPOFOL (N/A ) POLYPECTOMY  Patient Location: Endoscopy Unit  Anesthesia Type:General  Level of Consciousness: awake, alert  and oriented  Airway & Oxygen Therapy: Patient Spontanous Breathing  Post-op Assessment: Report given to RN and Post -op Vital signs reviewed and stable  Post vital signs: Reviewed and stable  Last Vitals:  Vitals Value Taken Time  BP    Temp    Pulse    Resp    SpO2      Last Pain:  Vitals:   05/02/21 1121  TempSrc:   PainSc: 0-No pain      Patients Stated Pain Goal: 10 (93/79/02 4097)  Complications: No complications documented.

## 2021-05-02 NOTE — Anesthesia Procedure Notes (Signed)
Date/Time: 05/02/2021 11:23 AM Performed by: Orlie Dakin, CRNA Pre-anesthesia Checklist: Emergency Drugs available, Patient identified, Suction available and Patient being monitored Patient Re-evaluated:Patient Re-evaluated prior to induction Oxygen Delivery Method: Nasal cannula Induction Type: IV induction Placement Confirmation: positive ETCO2

## 2021-05-02 NOTE — Op Note (Signed)
[facility] Patient Name: Kara Mathis Procedure Date: 05/02/2021 11:12 AM MRN: [id number] Date of Birth: [birth year] Attending MD: Elon Alas. Abbey Chatters DO CSN: [id number] Age: 60 Admit Type: Outpatient Procedure:                Colonoscopy Indications:              Screening for colorectal malignant neoplasm Providers:                Elon Alas. Abbey Chatters, DO, Gwenlyn Fudge, RN, Aram Candela Referring MD:              Medicines:                See the Anesthesia note for documentation of the                            administered medications Complications:            No immediate complications. Estimated Blood Loss:     Estimated blood loss was minimal. Procedure:                Pre-Anesthesia Assessment:                           - The anesthesia plan was to use monitored                            anesthesia care (MAC).                           After obtaining informed consent, the colonoscope                            was passed under direct vision. Throughout the                            procedure, the patient's blood pressure, pulse, and                            oxygen saturations were monitored continuously. The                            [id number]-PH190L ([id number]) scope was introduced through                            the anus and advanced to the the cecum, identified                            by appendiceal orifice and ileocecal valve. The                            colonoscopy was performed without difficulty. The                            patient tolerated the procedure well. The quality  of the bowel preparation was evaluated using the                            BBPS Kara Mathis Bowel Preparation Scale) with scores                            of: Right Colon = 3, Transverse Colon = 3 and Left                            Colon = 3 (entire mucosa seen well with no residual                            staining, small fragments  of stool or opaque                            liquid). The total BBPS score equals 9. Scope In: 11:24:54 AM Scope Out: 11:51:10 AM Scope Withdrawal Time: 0 hours 19 minutes 2 seconds  Total Procedure Duration: 0 hours 26 minutes 16 seconds  Findings:      The perianal and digital rectal examinations were normal.      Non-bleeding internal hemorrhoids were found during endoscopy.      Multiple small-mouthed diverticula were found in the sigmoid colon.      A 12 mm polyp was found in the sigmoid colon. The polyp was carpet-like       and flat. Preparations were made for mucosal resection. Orise gel was       injected with adequate lift of the lesion from the muscularis propria.       Polyp margins were well demarcated. Snare mucosal resection was       performed. A 15 mm area was resected. Resection and retrieval were       complete. There was no bleeding during and at the end of the procedure. Impression:               - Non-bleeding internal hemorrhoids.                           - Diverticulosis in the sigmoid colon.                           - One 12 mm polyp in the sigmoid colon, removed                            with mucosal resection. Resected and retrieved.                           - Mucosal resection was performed. Resection and                            retrieval were complete. Moderate Sedation:      Per Anesthesia Care Recommendation:           - Patient has a contact number available for                            emergencies. The signs  and symptoms of potential                            delayed complications were discussed with the                            patient. Return to normal activities tomorrow.                            Written discharge instructions were provided to the                            patient.                           - Resume previous diet.                           - Continue present medications.                           - Await pathology  results.                           - Repeat colonoscopy in 3 years for surveillance.                           - Return to GI clinic PRN. Procedure Code(s):        --- Professional ---                           [phone], Colonoscopy, flexible; with endoscopic                            mucosal resection Diagnosis Code(s):        --- Professional ---                           Z12.11, Encounter for screening for malignant                            neoplasm of colon                           K64.8, Other hemorrhoids                           K63.5, Polyp of colon                           K57.30, Diverticulosis of large intestine without                            perforation or abscess without bleeding CPT copyright 2019 American Medical Association. All rights reserved. The codes documented in this report are preliminary and upon coder review may  be revised to meet current compliance requirements. Elon Alas. Abbey Chatters, DO [name], DO 05/02/2021 12:04:07 PM This  report has been signed electronically. Number of Addenda: 0

## 2021-05-02 NOTE — Discharge Instructions (Addendum)
Colon Polyps  Colon polyps are tissue growths inside the colon, which is part of the large intestine. They are one of the types of polyps that can grow in the body. A polyp may be a round bump or a mushroom-shaped growth. You could have one polyp or more than one. Most colon polyps are noncancerous (benign). However, some colon polyps can become cancerous over time. Finding and removing the polyps early can help prevent this. What are the causes? The exact cause of colon polyps is not known. What increases the risk? The following factors may make you more likely to develop this condition:  Having a family history of colorectal cancer or colon polyps.  Being older than 61 years of age.  Being younger than 61 years of age and having a significant family history of colorectal cancer or colon polyps or a genetic condition that puts you at higher risk of getting colon polyps.  Having inflammatory bowel disease, such as ulcerative colitis or Crohn's disease.  Having certain conditions passed from parent to child (hereditary conditions), such as: ? Familial adenomatous polyposis (FAP). ? Lynch syndrome. ? Turcot syndrome. ? Peutz-Jeghers syndrome. ? MUTYH-associated polyposis (MAP).  Being overweight.  Certain lifestyle factors. These include smoking cigarettes, drinking too much alcohol, not getting enough exercise, and eating a diet that is high in fat and red meat and low in fiber.  Having had childhood cancer that was treated with radiation of the abdomen. What are the signs or symptoms? Many times, there are no symptoms. If you have symptoms, they may include:  Blood coming from the rectum during a bowel movement.  Blood in the stool (feces). The blood may be bright red or very dark in color.  Pain in the abdomen.  A change in bowel habits, such as constipation or diarrhea. How is this diagnosed? This condition is diagnosed with a colonoscopy. This is a procedure in which a  lighted, flexible scope is inserted into the opening between the buttocks (anus) and then passed into the colon to examine the area. Polyps are sometimes found when a colonoscopy is done as part of routine cancer screening tests. How is this treated? This condition is treated by removing any polyps that are found. Most polyps can be removed during a colonoscopy. Those polyps will then be tested for cancer. Additional treatment may be needed depending on the results of testing. Follow these instructions at home: Eating and drinking  Eat foods that are high in fiber, such as fruits, vegetables, and whole grains.  Eat foods that are high in calcium and vitamin D, such as milk, cheese, yogurt, eggs, liver, fish, and broccoli.  Limit foods that are high in fat, such as fried foods and desserts.  Limit the amount of red meat, precooked or cured meat, or other processed meat that you eat, such as hot dogs, sausages, bacon, or meat loaves.  Limit sugary drinks.   Lifestyle  Maintain a healthy weight, or lose weight if recommended by your health care provider.  Exercise every day or as told by your health care provider.  Do not use any products that contain nicotine or tobacco, such as cigarettes, e-cigarettes, and chewing tobacco. If you need help quitting, ask your health care provider.  Do not drink alcohol if: ? Your health care provider tells you not to drink. ? You are pregnant, may be pregnant, or are planning to become pregnant.  If you drink alcohol: ? Limit how much you use to:  0-1 drink a day for women.  0-2 drinks a day for men. ? Know how much alcohol is in your drink. In the [place] one drink equals one 12 oz bottle of beer (355 mL), one 5 oz glass of wine (148 mL), or one 1 oz glass of hard liquor (44 mL). General instructions  Take over-the-counter and prescription medicines only as told by your health care provider.  Keep all follow-up visits. This is important. This  includes having regularly scheduled colonoscopies. Talk to your health care provider about when you need a colonoscopy. Contact a health care provider if:  You have new or worsening bleeding during a bowel movement.  You have new or increased blood in your stool.  You have a change in bowel habits.  You lose weight for no known reason. Summary  Colon polyps are tissue growths inside the colon, which is part of the large intestine. They are one type of polyp that can grow in the body.  Most colon polyps are noncancerous (benign), but some can become cancerous over time.  This condition is diagnosed with a colonoscopy.  This condition is treated by removing any polyps that are found. Most polyps can be removed during a colonoscopy. This information is not intended to replace advice given to you by your health care provider. Make sure you discuss any questions you have with your health care provider. Document Revised: 04/03/2020 Document Reviewed: 04/03/2020 Elsevier Patient Education  2021 [place].  Colonoscopy Discharge Instructions  Read the instructions outlined below and refer to this sheet in the next few weeks. These discharge instructions provide you with general information on caring for yourself after you leave the hospital. Your doctor may also give you specific instructions. While your treatment has been planned according to the most current medical practices available, unavoidable complications occasionally occur.   ACTIVITY  You may resume your regular activity, but move at a slower pace for the next 24 hours.   Take frequent rest periods for the next 24 hours.   Walking will help get rid of the air and reduce the bloated feeling in your belly (abdomen).   No driving for 24 hours (because of the medicine (anesthesia) used during the test).    Do not sign any important legal documents or operate any machinery for 24 hours (because of the anesthesia used during  the test).  NUTRITION  Drink plenty of fluids.   You may resume your normal diet as instructed by your doctor.   Begin with a light meal and progress to your normal diet. Heavy or fried foods are harder to digest and may make you feel sick to your stomach (nauseated).   Avoid alcoholic beverages for 24 hours or as instructed.  MEDICATIONS  You may resume your normal medications unless your doctor tells you otherwise.  WHAT YOU CAN EXPECT TODAY  Some feelings of bloating in the abdomen.   Passage of more gas than usual.   Spotting of blood in your stool or on the toilet paper.  IF YOU HAD POLYPS REMOVED DURING THE COLONOSCOPY:  No aspirin products for 7 days or as instructed.   No alcohol for 7 days or as instructed.   Eat a soft diet for the next 24 hours.  FINDING OUT THE RESULTS OF YOUR TEST Not all test results are available during your visit. If your test results are not back during the visit, make an appointment with your caregiver to find out the results. Do  not assume everything is normal if you have not heard from your caregiver or the medical facility. It is important for you to follow up on all of your test results.  SEEK IMMEDIATE MEDICAL ATTENTION IF:  You have more than a spotting of blood in your stool.   Your belly is swollen (abdominal distention).   You are nauseated or vomiting.   You have a temperature over 101.   You have abdominal pain or discomfort that is severe or gets worse throughout the day.   Your colonoscopy revealed 1 large  polyp which I removed successfully. Await pathology results, my office will contact you. I recommend repeating colonoscopy in 3 years for surveillance purposes. Otherwise follow up with GI as needed.   I hope you have a great rest of your week!  Elon Alas. Abbey Chatters, D.O. Gastroenterology and Hepatology [facility]

## 2021-05-02 NOTE — Anesthesia Postprocedure Evaluation (Signed)
Anesthesia Post Note  Patient: Kara Mathis  Procedure(s) Performed: COLONOSCOPY WITH PROPOFOL (N/A ) POLYPECTOMY  Patient location during evaluation: Endoscopy Anesthesia Type: General Level of consciousness: awake and alert and oriented Pain management: pain level controlled Vital Signs Assessment: post-procedure vital signs reviewed and stable Respiratory status: spontaneous breathing and respiratory function stable Cardiovascular status: blood pressure returned to baseline and stable Postop Assessment: no apparent nausea or vomiting Anesthetic complications: no   No complications documented.   Last Vitals:  Vitals:   05/02/21 1051 05/02/21 1157  BP: (!) 147/63 (!) 110/59  Pulse: 74 69  Resp: 13 10  Temp: 36.8 C 36.6 C  SpO2: 99% 100%    Last Pain:  Vitals:   05/02/21 1201  TempSrc:   PainSc: 4                  Tabria Steines C Dannilynn Gallina

## 2021-05-02 NOTE — H&P (Signed)
Primary Care Physician:  [name], FNP Primary Gastroenterologist:  Dr. Abbey Chatters  Pre-Procedure History & Physical: HPI:  Kara Mathis is a 60 y.o. female is here for a colonoscopy for colon cancer screening purposes.  Patient denies any family history of colorectal cancer.  No melena or hematochezia.  No abdominal pain or unintentional weight loss.  No change in bowel habits.  Overall feels well from a GI standpoint.  Past Medical History:  Diagnosis Date  . Arthritis   . Hyperlipidemia   . Hypertension   . Type 2 diabetes mellitus ([place]) 07/03/2020   A1c 6.8  . Uterine fibroid   . Vitamin D deficiency     Past Surgical History:  Procedure Laterality Date  . MOUTH SURGERY    . TUBAL LIGATION  07/02/1986    Prior to Admission medications   Medication Sig Start Date End Date Taking? Authorizing Provider  atorvastatin (LIPITOR) 10 MG tablet Take 1 tablet (10 mg total) by mouth daily. Patient taking differently: Take 10 mg by mouth in the morning. 03/06/21  Yes [name] F, FNP  diclofenac (VOLTAREN) 75 MG EC tablet Take 1 tablet (75 mg total) by mouth 2 (two) times daily. Patient taking differently: Take 75 mg by mouth 2 (two) times daily. (1000 & 2200) 03/06/21  Yes [name] F, FNP  hydrochlorothiazide (MICROZIDE) 12.5 MG capsule Take 1 capsule (12.5 mg total) by mouth daily. Patient taking differently: Take 12.5 mg by mouth in the morning. 03/06/21  Yes [name] F, FNP  lisinopril (ZESTRIL) 40 MG tablet Take 1 tablet (40 mg total) by mouth daily. Patient taking differently: Take 40 mg by mouth every evening. 03/06/21  Yes [name] F, FNP  Vitamin D, Ergocalciferol, (DRISDOL) 1.25 MG (50000 UNIT) CAPS capsule Take 1 capsule (50,000 Units total) by mouth every 7 (seven) days. Patient taking differently: Take 50,000 Units by mouth every Tuesday. 03/11/21  Yes [name] F, FNP    Allergies as of 04/03/2021 - Review Complete 04/02/2021  Allergen Reaction  Noted  . Asa [aspirin]  06/05/2020  . Aleve [naproxen] Palpitations 04/02/2021    Family History  Problem Relation Age of Onset  . Heart attack Father     Social History   Socioeconomic History  . Marital status: Widowed    Spouse name: Not on file  . Number of children: Not on file  . Years of education: Not on file  . Highest education level: Not on file  Occupational History  . Not on file  Tobacco Use  . Smoking status: Never Smoker  . Smokeless tobacco: Never Used  Vaping Use  . Vaping Use: Never used  Substance and Sexual Activity  . Alcohol use: Never  . Drug use: Never  . Sexual activity: Not on file  Other Topics Concern  . Not on file  Social History Narrative  . Not on file   Social Determinants of Health   Financial Resource Strain: Not on file  Food Insecurity: Not on file  Transportation Needs: Not on file  Physical Activity: Not on file  Stress: Not on file  Social Connections: Not on file  Intimate Partner Violence: Not on file    Review of Systems: See HPI, otherwise negative ROS  Physical Exam: Vital signs in last 24 hours: Temp:  [98.3 F (36.8 C)] 98.3 F (36.8 C) (05/06 1051) Pulse Rate:  [74] 74 (05/06 1051) Resp:  [13] 13 (05/06 1051) BP: (147)/(63) 147/63 (05/06 1051) SpO2:  [99 %]  99 % (05/06 1051) Weight:  [91.6 kg] 91.6 kg (05/06 1039)   General:   Alert,  Well-developed, well-nourished, pleasant and cooperative in NAD Head:  Normocephalic and atraumatic. Eyes:  Sclera clear, no icterus.   Conjunctiva pink. Ears:  Normal auditory acuity. Nose:  No deformity, discharge,  or lesions. Mouth:  No deformity or lesions, dentition normal. Neck:  Supple; no masses or thyromegaly. Lungs:  Clear throughout to auscultation.   No wheezes, crackles, or rhonchi. No acute distress. Heart:  Regular rate and rhythm; no murmurs, clicks, rubs,  or gallops. Abdomen:  Soft, nontender and nondistended. No masses, hepatosplenomegaly or hernias  noted. Normal bowel sounds, without guarding, and without rebound.   Msk:  Symmetrical without gross deformities. Normal posture. Extremities:  Without clubbing or edema. Neurologic:  Alert and  oriented x4;  grossly normal neurologically. Skin:  Intact without significant lesions or rashes. Cervical Nodes:  No significant cervical adenopathy. Psych:  Alert and cooperative. Normal mood and affect.  Impression/Plan: Kara Mathis is here for a colonoscopy to be performed for colon cancer screening purposes.  The risks of the procedure including infection, bleed, or perforation as well as benefits, limitations, alternatives and imponderables have been reviewed with the patient. Questions have been answered. All parties agreeable.

## 2021-05-02 NOTE — Anesthesia Preprocedure Evaluation (Addendum)
Anesthesia Evaluation  Patient identified by MRN, date of birth, ID band Patient awake    Reviewed: Allergy & Precautions, NPO status , Patient's Chart, lab work & pertinent test results  Airway Mallampati: II  TM Distance: >3 FB Neck ROM: Full    Dental  (+) Dental Advisory Given, Missing   Pulmonary neg pulmonary ROS,    Pulmonary exam normal breath sounds clear to auscultation       Cardiovascular Exercise Tolerance: Good hypertension, Pt. on medications Normal cardiovascular exam Rhythm:Regular Rate:Normal     Neuro/Psych negative neurological ROS  negative psych ROS   GI/Hepatic negative GI ROS, Neg liver ROS,   Endo/Other  diabetes, Well Controlled, Type 2, Oral Hypoglycemic Agents  Renal/GU negative Renal ROS     Musculoskeletal  (+) Arthritis ,   Abdominal   Peds  Hematology negative hematology ROS (+)   Anesthesia Other Findings   Reproductive/Obstetrics                            Anesthesia Physical Anesthesia Plan  ASA: II  Anesthesia Plan: General   Post-op Pain Management:    Induction: Intravenous  PONV Risk Score and Plan: Propofol infusion  Airway Management Planned: Nasal Cannula and Natural Airway  Additional Equipment:   Intra-op Plan:   Post-operative Plan:   Informed Consent: I have reviewed the patients History and Physical, chart, labs and discussed the procedure including the risks, benefits and alternatives for the proposed anesthesia with the patient or authorized representative who has indicated his/her understanding and acceptance.     Dental advisory given  Plan Discussed with: CRNA and Surgeon  Anesthesia Plan Comments:        Anesthesia Quick Evaluation

## 2021-05-05 LAB — SURGICAL PATHOLOGY

## 2021-05-08 ENCOUNTER — Encounter (HOSPITAL_COMMUNITY): Payer: Self-pay | Admitting: Internal Medicine

## 2021-06-04 ENCOUNTER — Other Ambulatory Visit: Payer: Self-pay | Admitting: Family Medicine

## 2021-06-04 DIAGNOSIS — E559 Vitamin D deficiency, unspecified: Secondary | ICD-10-CM

## 2021-06-11 ENCOUNTER — Encounter: Payer: 59 | Admitting: Family Medicine

## 2021-07-02 ENCOUNTER — Encounter: Payer: Self-pay | Admitting: Family Medicine

## 2021-07-02 ENCOUNTER — Other Ambulatory Visit (HOSPITAL_COMMUNITY)
Admission: RE | Admit: 2021-07-02 | Discharge: 2021-07-02 | Disposition: A | Discharge status: Home / Self Care | Payer: 59 | Source: Ambulatory Visit | Attending: Family Medicine | Admitting: Family Medicine

## 2021-07-02 ENCOUNTER — Ambulatory Visit (INDEPENDENT_AMBULATORY_CARE_PROVIDER_SITE_OTHER): Payer: 59 | Admitting: Family Medicine

## 2021-07-02 ENCOUNTER — Other Ambulatory Visit: Payer: Self-pay

## 2021-07-02 VITALS — BP 131/70 | HR 59 | Temp 97.9°F | Ht 70.0 in | Wt 199.8 lb

## 2021-07-02 DIAGNOSIS — I10 Essential (primary) hypertension: Secondary | ICD-10-CM | POA: Diagnosis not present

## 2021-07-02 DIAGNOSIS — Z124 Encounter for screening for malignant neoplasm of cervix: Secondary | ICD-10-CM | POA: Diagnosis present

## 2021-07-02 DIAGNOSIS — N3941 Urge incontinence: Secondary | ICD-10-CM | POA: Insufficient documentation

## 2021-07-02 DIAGNOSIS — Z113 Encounter for screening for infections with a predominantly sexual mode of transmission: Secondary | ICD-10-CM

## 2021-07-02 DIAGNOSIS — E782 Mixed hyperlipidemia: Secondary | ICD-10-CM | POA: Diagnosis not present

## 2021-07-02 DIAGNOSIS — Z0000 Encounter for general adult medical examination without abnormal findings: Secondary | ICD-10-CM

## 2021-07-02 DIAGNOSIS — M1990 Unspecified osteoarthritis, unspecified site: Secondary | ICD-10-CM

## 2021-07-02 DIAGNOSIS — Z1151 Encounter for screening for human papillomavirus (HPV): Secondary | ICD-10-CM | POA: Insufficient documentation

## 2021-07-02 DIAGNOSIS — E1165 Type 2 diabetes mellitus with hyperglycemia: Secondary | ICD-10-CM

## 2021-07-02 DIAGNOSIS — Z0001 Encounter for general adult medical examination with abnormal findings: Secondary | ICD-10-CM | POA: Diagnosis not present

## 2021-07-02 DIAGNOSIS — E559 Vitamin D deficiency, unspecified: Secondary | ICD-10-CM

## 2021-07-02 LAB — BAYER DCA HB A1C WAIVED: HB A1C (BAYER DCA - WAIVED): 6 % (ref <7.0)

## 2021-07-02 NOTE — Patient Instructions (Signed)
Preventive Care 68-61 Years Old, Female Preventive care refers to lifestyle choices and visits with your health care provider that can promote health and wellness. This includes: A yearly physical exam. This is also called an annual wellness visit. Regular dental and eye exams. Immunizations. Screening for certain conditions. Healthy lifestyle choices, such as: Eating a healthy diet. Getting regular exercise. Not using drugs or products that contain nicotine and tobacco. Limiting alcohol use. What can I expect for my preventive care visit? Physical exam Your health care provider will check your: Height and weight. These may be used to calculate your BMI (body mass index). BMI is a measurement that tells if you are at a healthy weight. Heart rate and blood pressure. Body temperature. Skin for abnormal spots. Counseling Your health care provider may ask you questions about your: Past medical problems. Family's medical history. Alcohol, tobacco, and drug use. Emotional well-being. Home life and relationship well-being. Sexual activity. Diet, exercise, and sleep habits. Work and work Statistician. Access to firearms. Method of birth control. Menstrual cycle. Pregnancy history. What immunizations do I need?  Vaccines are usually given at various ages, according to a schedule. Your health care provider will recommend vaccines for you based on your age, medicalhistory, and lifestyle or other factors, such as travel or where you work. What tests do I need? Blood tests Lipid and cholesterol levels. These may be checked every 5 years, or more often if you are over 37 years old. Hepatitis C test. Hepatitis B test. Screening Lung cancer screening. You may have this screening every year starting at age 30 if you have a 30-pack-year history of smoking and currently smoke or have quit within the past 15 years. Colorectal cancer screening. All adults should have this screening starting at  age 23 and continuing until age 3. Your health care provider may recommend screening at age 88 if you are at increased risk. You will have tests every 1-10 years, depending on your results and the type of screening test. Diabetes screening. This is done by checking your blood sugar (glucose) after you have not eaten for a while (fasting). You may have this done every 1-3 years. Mammogram. This may be done every 1-2 years. Talk with your health care provider about when you should start having regular mammograms. This may depend on whether you have a family history of breast cancer. BRCA-related cancer screening. This may be done if you have a family history of breast, ovarian, tubal, or peritoneal cancers. Pelvic exam and Pap test. This may be done every 3 years starting at age 79. Starting at age 54, this may be done every 5 years if you have a Pap test in combination with an HPV test. Other tests STD (sexually transmitted disease) testing, if you are at risk. Bone density scan. This is done to screen for osteoporosis. You may have this scan if you are at high risk for osteoporosis. Talk with your health care provider about your test results, treatment options,and if necessary, the need for more tests. Follow these instructions at home: Eating and drinking  Eat a diet that includes fresh fruits and vegetables, whole grains, lean protein, and low-fat dairy products. Take vitamin and mineral supplements as recommended by your health care provider. Do not drink alcohol if: Your health care provider tells you not to drink. You are pregnant, may be pregnant, or are planning to become pregnant. If you drink alcohol: Limit how much you have to 0-1 drink a day. Be aware  of how much alcohol is in your drink. In the [place] one drink equals one 12 oz bottle of beer (355 mL), one 5 oz glass of wine (148 mL), or one 1 oz glass of hard liquor (44 mL).  Lifestyle Take daily care of your teeth and  gums. Brush your teeth every morning and night with fluoride toothpaste. Floss one time each day. Stay active. Exercise for at least 30 minutes 5 or more days each week. Do not use any products that contain nicotine or tobacco, such as cigarettes, e-cigarettes, and chewing tobacco. If you need help quitting, ask your health care provider. Do not use drugs. If you are sexually active, practice safe sex. Use a condom or other form of protection to prevent STIs (sexually transmitted infections). If you do not wish to become pregnant, use a form of birth control. If you plan to become pregnant, see your health care provider for a prepregnancy visit. If told by your health care provider, take low-dose aspirin daily starting at age 29. Find healthy ways to cope with stress, such as: Meditation, yoga, or listening to music. Journaling. Talking to a trusted person. Spending time with friends and family. Safety Always wear your seat belt while driving or riding in a vehicle. Do not drive: If you have been drinking alcohol. Do not ride with someone who has been drinking. When you are tired or distracted. While texting. Wear a helmet and other protective equipment during sports activities. If you have firearms in your house, make sure you follow all gun safety procedures. What's next? Visit your health care provider once a year for an annual wellness visit. Ask your health care provider how often you should have your eyes and teeth checked. Stay up to date on all vaccines. This information is not intended to replace advice given to you by your health care provider. Make sure you discuss any questions you have with your healthcare provider. Document Revised: 09/17/2020 Document Reviewed: 08/25/2018 Elsevier Patient Education  2022 [employer].

## 2021-07-02 NOTE — Progress Notes (Signed)
Assessment & Plan:  1. Well adult exam Preventive health education provided. Patient declined mammogram, hepatitis C & HIV screening, Shingrix, and TDAP. - Lipid panel - CBC with Differential/Platelet - CMP14+EGFR  2. Type 2 diabetes mellitus with hyperglycemia, without long-term current use of insulin (HCC) Lab Results  Component Value Date   HGBA1C 6.0 07/02/2021   HGBA1C 6.5 03/06/2021   HGBA1C 6.4 10/09/2020    - Diabetes is at goal of A1c < 7. - Medications:  none - diet controlled. - Patient is currently taking a statin. Patient is taking an ACE-inhibitor/ARB.   Diabetes Health Maintenance Due  Topic Date Due   OPHTHALMOLOGY EXAM  Never done   HEMOGLOBIN A1C  01/02/2022   FOOT EXAM  03/06/2022    - Lipid panel - CBC with Differential/Platelet - CMP14+EGFR - Bayer DCA Hb A1c Waived  3. Essential hypertension Well controlled on current regimen.  - Lipid panel - CBC with Differential/Platelet - CMP14+EGFR  4. Mixed hyperlipidemia Well controlled on current regimen.  - Lipid panel - CMP14+EGFR  5. Arthritis Well controlled on current regimen.  - CMP14+EGFR  6. Vitamin D deficiency Patient is taking supplement as recommended after labs resulted at our last visit.  7. Screening for cervical cancer - Cytology - PAP  8. Routine screening for STI (sexually transmitted infection) - Cytology - PAP  9. Screening for human papillomavirus (HPV) - Cytology - PAP  10. Urge incontinence of urine Discussed options of Kegel exercises, pelvic health physical therapy, and medication. She would like to try Kegel exercises first - these were provided.    Follow-up: Return in about 6 months (around 01/02/2022) for follow-up of chronic medication conditions.   [name], MSN, APRN, FNP-C [facility]  Subjective:  Patient ID: Kara Mathis, female    DOB: 03/15/1961  Age: 60 y.o. MRN: [id number]  Patient Care Team: [name], FNP as  PCP - General (Family Medicine) Okey Regal, [place] (Optometry) Gala Romney, Cristopher Estimable, MD as Consulting Physician (Gastroenterology)   CC:  Chief Complaint  Patient presents with   Gynecologic Exam    HPI Kara Mathis presents for her annual physical.  Occupation: [employer], Marital status: Widowed, Substance use: None Diet: DASH, Exercise: None Last eye exam: Few months ago Last dental exam: Very recent - currently on antibiotic after having tooth pulled Last colonoscopy: 05/02/2021 to repeat in 3 years Last mammogram: 2011 Last pap smear: Unknown Hepatitis C Screening: Declined Immunizations: Flu Vaccine: up to date Tdap Vaccine: declined  Shingrix Vaccine: declined  COVID-19 Vaccine: up to date  DEPRESSION SCREENING PHQ 2/9 Scores 07/02/2021 03/06/2021 11/28/2020 10/09/2020 08/28/2020 07/31/2020 07/03/2020  PHQ - 2 Score 0 0 0 0 0 0 0  PHQ- 9 Score 2 - - - - - -     Review of Systems  Constitutional:  Negative for chills, fever, malaise/fatigue and weight loss.  HENT:  Negative for congestion, ear discharge, ear pain, nosebleeds, sinus pain, sore throat and tinnitus.   Eyes:  Negative for blurred vision, double vision, pain, discharge and redness.  Respiratory:  Negative for cough, shortness of breath and wheezing.   Cardiovascular:  Negative for chest pain, palpitations and leg swelling.  Gastrointestinal:  Negative for abdominal pain, constipation, diarrhea, heartburn, nausea and vomiting.  Genitourinary:  Negative for dysuria, frequency and urgency.       Positive for urge incontinence.  Musculoskeletal:  Negative for myalgias.  Skin:  Negative for rash.  Neurological:  Negative for dizziness,  seizures, weakness and headaches.  Psychiatric/Behavioral:  Negative for depression, substance abuse and suicidal ideas. The patient is not nervous/anxious.     Current Outpatient Medications:    amoxicillin (AMOXIL) 500 MG capsule, Take 500 mg by mouth 3 (three) times daily., Disp: , Rfl:     atorvastatin (LIPITOR) 10 MG tablet, Take 1 tablet (10 mg total) by mouth daily. (Patient taking differently: Take 10 mg by mouth in the morning.), Disp: 90 tablet, Rfl: 1   diclofenac (VOLTAREN) 75 MG EC tablet, Take 1 tablet (75 mg total) by mouth 2 (two) times daily. (Patient taking differently: Take 75 mg by mouth 2 (two) times daily. (1000 & 2200)), Disp: 180 tablet, Rfl: 1   hydrochlorothiazide (MICROZIDE) 12.5 MG capsule, Take 1 capsule (12.5 mg total) by mouth daily. (Patient taking differently: Take 12.5 mg by mouth in the morning.), Disp: 90 capsule, Rfl: 1   lisinopril (ZESTRIL) 40 MG tablet, Take 1 tablet (40 mg total) by mouth daily. (Patient taking differently: Take 40 mg by mouth every evening.), Disp: 90 tablet, Rfl: 1   Vitamin D, Ergocalciferol, (DRISDOL) 1.25 MG (50000 UNIT) CAPS capsule, TAKE 1 CAPSULE BY MOUTH EVERY 7 DAYS, Disp: 12 capsule, Rfl: 0  Allergies  Allergen Reactions   Asa [Aspirin]     Shaky and dizzy    Aleve [Naproxen] Palpitations    Past Medical History:  Diagnosis Date   Arthritis    Hyperlipidemia    Hypertension    Type 2 diabetes mellitus ([place]) 07/03/2020   A1c 6.8   Uterine fibroid    Vitamin D deficiency     Past Surgical History:  Procedure Laterality Date   COLONOSCOPY WITH PROPOFOL N/A 05/02/2021   Procedure: COLONOSCOPY WITH PROPOFOL;  Surgeon: Eloise Harman, DO;  Location: [facility];  Service: Endoscopy;  Laterality: N/A;  ASA II / PM procedure   MOUTH SURGERY     POLYPECTOMY  05/02/2021   Procedure: POLYPECTOMY;  Surgeon: Eloise Harman, DO;  Location: [facility];  Service: Endoscopy;;   TUBAL LIGATION  07/02/1986    Family History  Problem Relation Age of Onset   Heart attack Father     Social History   Socioeconomic History   Marital status: Widowed    Spouse name: Not on file   Number of children: Not on file   Years of education: Not on file   Highest education level: Not on file  Occupational History    Not on file  Tobacco Use   Smoking status: Never   Smokeless tobacco: Never  Vaping Use   Vaping Use: Never used  Substance and Sexual Activity   Alcohol use: Never   Drug use: Never   Sexual activity: Not on file  Other Topics Concern   Not on file  Social History Narrative   Not on file   Social Determinants of Health   Financial Resource Strain: Not on file  Food Insecurity: Not on file  Transportation Needs: Not on file  Physical Activity: Not on file  Stress: Not on file  Social Connections: Not on file  Intimate Partner Violence: Not on file      Objective:    BP 131/70   Pulse (!) 59   Temp 97.9 F (36.6 C) (Temporal)   Ht 5' 10"  (1.778 m)   Wt 199 lb 12.8 oz (90.6 kg)   LMP 03/17/2017   SpO2 100%   BMI 28.67 kg/m   Wt Readings from Last 3  Encounters:  07/02/21 199 lb 12.8 oz (90.6 kg)  05/02/21 202 lb (91.6 kg)  04/02/21 202 lb (91.6 kg)    Physical Exam Vitals reviewed. Exam conducted with a chaperone present.  Constitutional:      General: She is not in acute distress.    Appearance: Normal appearance. She is not ill-appearing, toxic-appearing or diaphoretic.  HENT:     Head: Normocephalic and atraumatic.     Right Ear: Tympanic membrane, ear canal and external ear normal. There is no impacted cerumen.     Left Ear: Tympanic membrane, ear canal and external ear normal. There is no impacted cerumen.     Nose: Nose normal. No congestion or rhinorrhea.     Mouth/Throat:     Mouth: Mucous membranes are moist.     Pharynx: Oropharynx is clear. No oropharyngeal exudate or posterior oropharyngeal erythema.  Eyes:     General: No scleral icterus.       Right eye: No discharge.        Left eye: No discharge.     Conjunctiva/sclera: Conjunctivae normal.     Pupils: Pupils are equal, round, and reactive to light.  Cardiovascular:     Rate and Rhythm: Normal rate and regular rhythm.     Heart sounds: Normal heart sounds. No murmur heard.   No  friction rub. No gallop.  Pulmonary:     Effort: Pulmonary effort is normal. No respiratory distress.     Breath sounds: Normal breath sounds. No stridor. No wheezing, rhonchi or rales.  Chest:     Chest wall: No mass, lacerations, deformity, swelling, tenderness, crepitus or edema. There is no dullness to percussion.  Breasts:    Breasts are symmetrical.     Right: Normal. No axillary adenopathy or supraclavicular adenopathy.     Left: Normal. No axillary adenopathy or supraclavicular adenopathy.  Abdominal:     General: Abdomen is flat. Bowel sounds are normal. There is no distension.     Palpations: Abdomen is soft. There is no mass.     Tenderness: There is no abdominal tenderness. There is no guarding or rebound.     Hernia: No hernia is present.  Genitourinary:    Pubic Area: No rash or pubic lice.      Labia:        Right: No rash, tenderness, lesion or injury.        Left: No rash, tenderness, lesion or injury.      Vagina: Normal.     Cervix: Normal.     Uterus: Normal.      Adnexa: Right adnexa normal and left adnexa normal.  Musculoskeletal:        General: Normal range of motion.     Cervical back: Normal range of motion and neck supple. No rigidity. No muscular tenderness.  Lymphadenopathy:     Cervical: No cervical adenopathy.     Upper Body:     Right upper body: No supraclavicular, axillary or pectoral adenopathy.     Left upper body: No supraclavicular, axillary or pectoral adenopathy.  Skin:    General: Skin is warm and dry.     Capillary Refill: Capillary refill takes less than 2 seconds.  Neurological:     General: No focal deficit present.     Mental Status: She is alert and oriented to person, place, and time. Mental status is at baseline.  Psychiatric:        Mood and Affect: Mood normal.  Behavior: Behavior normal.        Thought Content: Thought content normal.        Judgment: Judgment normal.    No results found for: TSH Lab Results   Component Value Date   WBC 7.0 03/06/2021   HGB 14.3 03/06/2021   HCT 44.4 03/06/2021   MCV 86 03/06/2021   PLT 220 03/06/2021   Lab Results  Component Value Date   NA 139 04/30/2021   K 4.3 04/30/2021   CO2 28 04/30/2021   GLUCOSE 102 (H) 04/30/2021   BUN 16 04/30/2021   CREATININE 0.67 04/30/2021   BILITOT 0.3 03/06/2021   ALKPHOS 65 03/06/2021   AST 9 03/06/2021   ALT 16 03/06/2021   PROT 6.8 03/06/2021   ALBUMIN 4.4 03/06/2021   CALCIUM 9.2 04/30/2021   ANIONGAP 6 04/30/2021   EGFR 87 03/06/2021   Lab Results  Component Value Date   CHOL 149 03/06/2021   Lab Results  Component Value Date   HDL 51 03/06/2021   Lab Results  Component Value Date   LDLCALC 76 03/06/2021   Lab Results  Component Value Date   TRIG 125 03/06/2021   Lab Results  Component Value Date   CHOLHDL 2.9 03/06/2021   Lab Results  Component Value Date   HGBA1C 6.5 03/06/2021

## 2021-07-03 LAB — CMP14+EGFR
ALT: 18 IU/L (ref 0–32)
AST: 11 IU/L (ref 0–40)
Albumin/Globulin Ratio: 1.9 (ref 1.2–2.2)
Albumin: 4.4 g/dL (ref 3.8–4.9)
Alkaline Phosphatase: 61 IU/L (ref 44–121)
BUN/Creatinine Ratio: 21 (ref 12–28)
BUN: 17 mg/dL (ref 8–27)
Bilirubin Total: 0.3 mg/dL (ref 0.0–1.2)
CO2: 25 mmol/L (ref 20–29)
Calcium: 9.1 mg/dL (ref 8.7–10.3)
Chloride: 103 mmol/L (ref 96–106)
Creatinine, Ser: 0.81 mg/dL (ref 0.57–1.00)
Globulin, Total: 2.3 g/dL (ref 1.5–4.5)
Glucose: 93 mg/dL (ref 65–99)
Potassium: 4.8 mmol/L (ref 3.5–5.2)
Sodium: 143 mmol/L (ref 134–144)
Total Protein: 6.7 g/dL (ref 6.0–8.5)
eGFR: 83 mL/min/{1.73_m2} (ref >59)

## 2021-07-03 LAB — CBC WITH DIFFERENTIAL/PLATELET
Basophils Absolute: 0 10*3/uL (ref 0.0–0.2)
Basos: 0 %
EOS (ABSOLUTE): 0.2 10*3/uL (ref 0.0–0.4)
Eos: 3 %
Hematocrit: 38.8 % (ref 34.0–46.6)
Hemoglobin: 13 g/dL (ref 11.1–15.9)
Immature Grans (Abs): 0 10*3/uL (ref 0.0–0.1)
Immature Granulocytes: 0 %
Lymphocytes Absolute: 1.9 10*3/uL (ref 0.7–3.1)
Lymphs: 39 %
MCH: 28.3 pg (ref 26.6–33.0)
MCHC: 33.5 g/dL (ref 31.5–35.7)
MCV: 85 fL (ref 79–97)
Monocytes Absolute: 0.4 10*3/uL (ref 0.1–0.9)
Monocytes: 7 %
Neutrophils Absolute: 2.5 10*3/uL (ref 1.4–7.0)
Neutrophils: 51 %
Platelets: 183 10*3/uL (ref 150–450)
RBC: 4.59 x10E6/uL (ref 3.77–5.28)
RDW: 13.2 % (ref 11.7–15.4)
WBC: 4.9 10*3/uL (ref 3.4–10.8)

## 2021-07-03 LAB — LIPID PANEL
Chol/HDL Ratio: 3.2 ratio (ref 0.0–4.4)
Cholesterol, Total: 157 mg/dL (ref 100–199)
HDL: 49 mg/dL (ref >39)
LDL Chol Calc (NIH): 87 mg/dL (ref 0–99)
Triglycerides: 119 mg/dL (ref 0–149)
VLDL Cholesterol Cal: 21 mg/dL (ref 5–40)

## 2021-07-03 NOTE — Progress Notes (Signed)
Has been re-faxed.

## 2021-07-07 LAB — CYTOLOGY - PAP
Chlamydia: NEGATIVE
Comment: NEGATIVE
Comment: NEGATIVE
Comment: NEGATIVE
Comment: NORMAL
Diagnosis: NEGATIVE
High risk HPV: NEGATIVE
Neisseria Gonorrhea: NEGATIVE
Trichomonas: NEGATIVE

## 2021-08-06 ENCOUNTER — Other Ambulatory Visit: Payer: Self-pay | Admitting: Family Medicine

## 2021-08-06 DIAGNOSIS — M1990 Unspecified osteoarthritis, unspecified site: Secondary | ICD-10-CM

## 2021-09-03 ENCOUNTER — Other Ambulatory Visit: Payer: Self-pay | Admitting: Family Medicine

## 2021-09-03 DIAGNOSIS — I10 Essential (primary) hypertension: Secondary | ICD-10-CM

## 2021-09-17 ENCOUNTER — Other Ambulatory Visit: Payer: Self-pay

## 2021-09-17 ENCOUNTER — Ambulatory Visit (INDEPENDENT_AMBULATORY_CARE_PROVIDER_SITE_OTHER): Payer: 59

## 2021-09-17 DIAGNOSIS — Z23 Encounter for immunization: Secondary | ICD-10-CM | POA: Diagnosis not present

## 2021-10-06 ENCOUNTER — Other Ambulatory Visit: Payer: Self-pay | Admitting: Family Medicine

## 2021-10-06 ENCOUNTER — Other Ambulatory Visit: Payer: Self-pay | Admitting: Nurse Practitioner

## 2021-10-06 DIAGNOSIS — E1165 Type 2 diabetes mellitus with hyperglycemia: Secondary | ICD-10-CM

## 2021-10-06 DIAGNOSIS — E782 Mixed hyperlipidemia: Secondary | ICD-10-CM

## 2021-10-06 DIAGNOSIS — I10 Essential (primary) hypertension: Secondary | ICD-10-CM

## 2021-10-31 IMAGING — DX DG KNEE 1-2V*R*
2 series · 2 of 2 positions shown · non-contrast
Comparison: None.

CLINICAL DATA: Chronic right knee pain.

EXAM:
RIGHT KNEE - 1-2 VIEW

[knee ap]
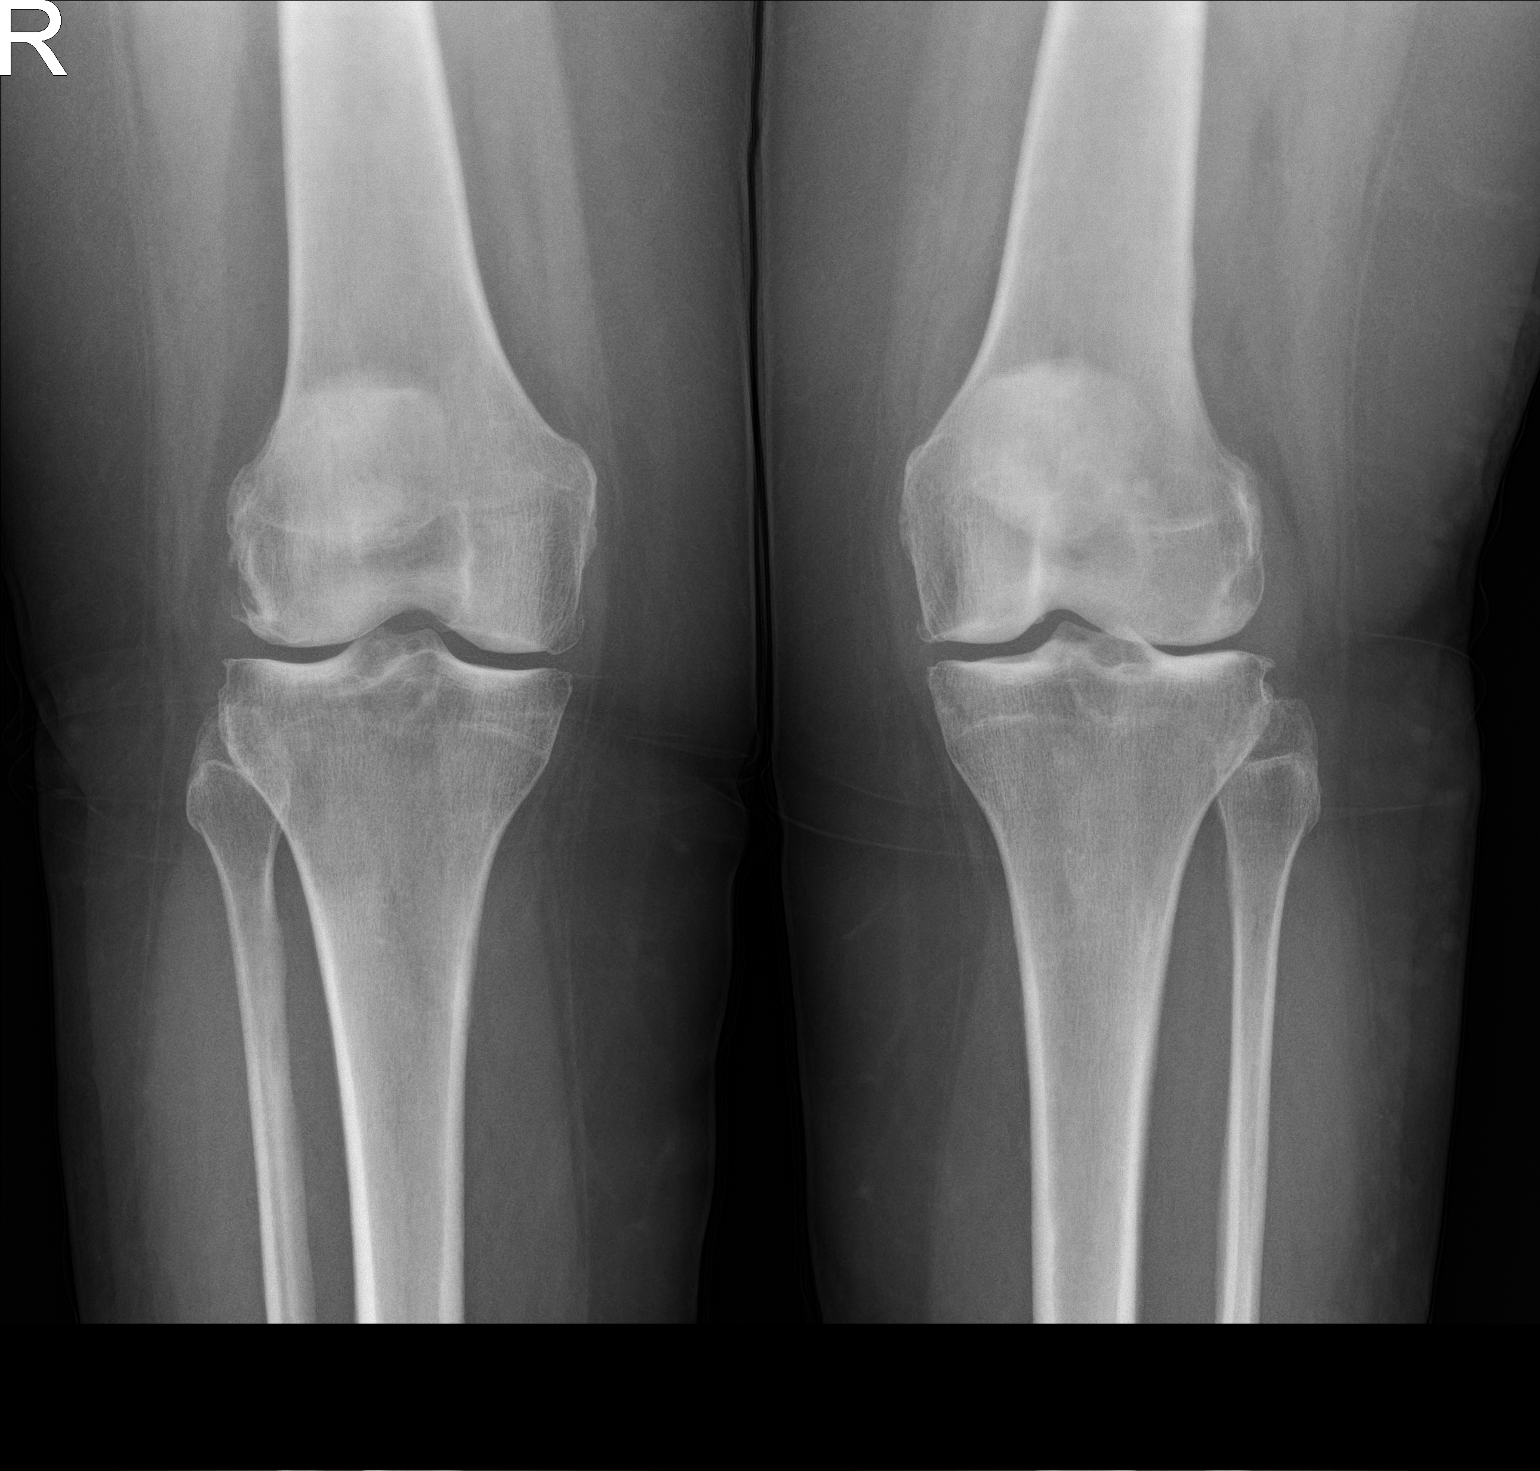

[knee lat]
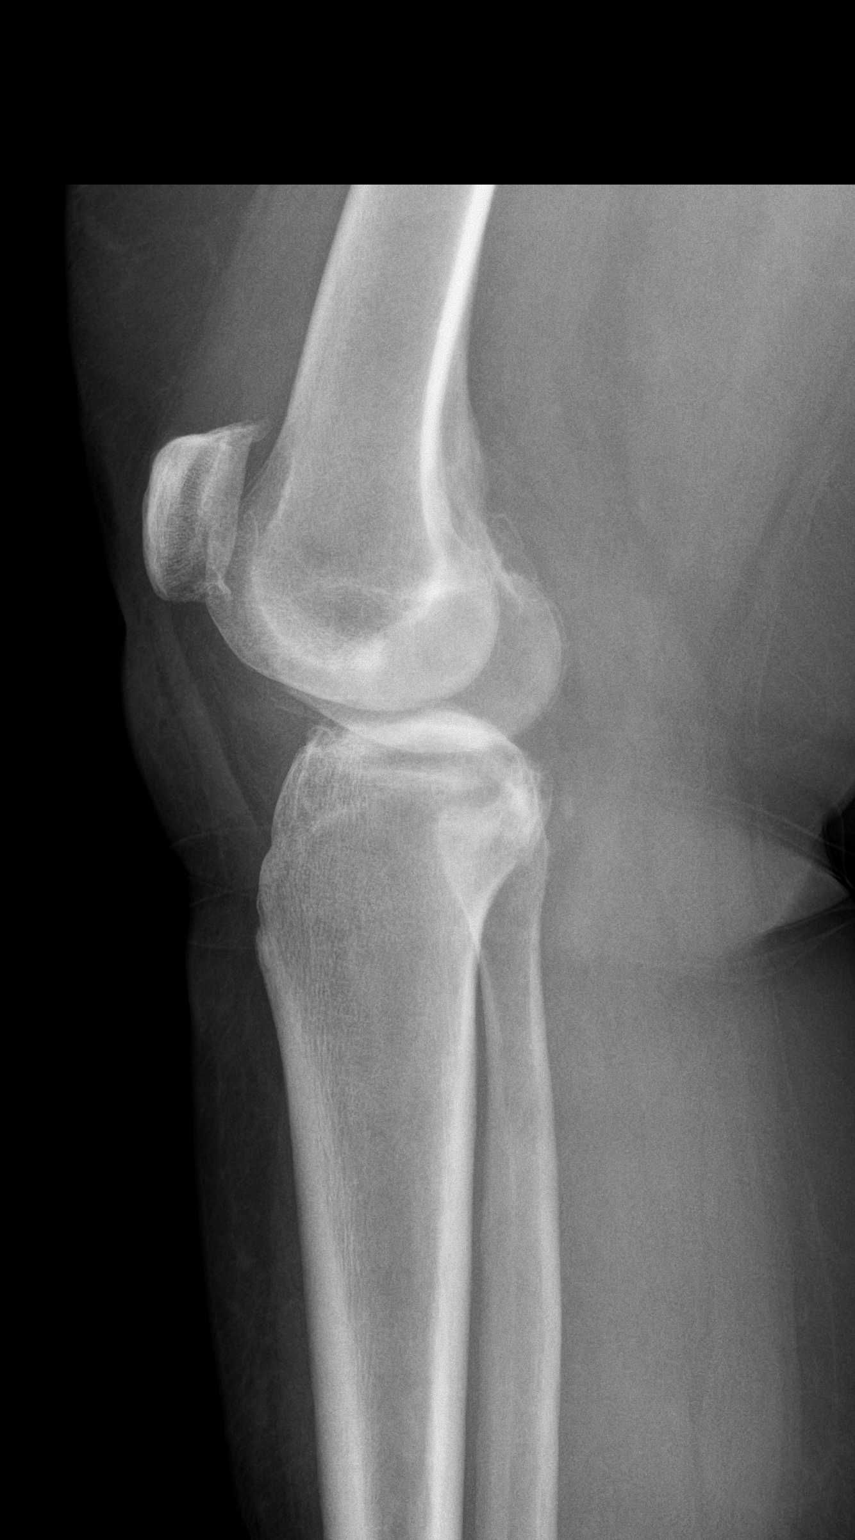

[2 of 2 positions shown; findings below may reference images not displayed]

FINDINGS: Upright frontal view standing and upright lateral view of the right
knee obtained. There is tricompartmental peripheral spurring most
prominent in the patellofemoral compartment. No asymmetric joint
space narrowing. No evidence of erosion or focal bone lesion. There
is a small knee joint effusion. Soft tissues are otherwise
unremarkable. Similar degenerative changes are seen in the left on
the frontal view.
IMPRESSION: Moderate tricompartmental osteoarthritis with peripheral spurring
most prominent in the patellofemoral compartment. Small knee joint
effusion.

## 2021-11-04 ENCOUNTER — Other Ambulatory Visit: Payer: Self-pay | Admitting: Family Medicine

## 2021-11-04 DIAGNOSIS — E559 Vitamin D deficiency, unspecified: Secondary | ICD-10-CM

## 2021-12-31 ENCOUNTER — Ambulatory Visit (INDEPENDENT_AMBULATORY_CARE_PROVIDER_SITE_OTHER): Payer: 59 | Admitting: Family Medicine

## 2021-12-31 ENCOUNTER — Encounter: Payer: Self-pay | Admitting: Family Medicine

## 2021-12-31 VITALS — BP 135/82 | HR 63 | Temp 97.9°F | Ht 70.0 in | Wt 202.6 lb

## 2021-12-31 DIAGNOSIS — R7303 Prediabetes: Secondary | ICD-10-CM

## 2021-12-31 DIAGNOSIS — M1990 Unspecified osteoarthritis, unspecified site: Secondary | ICD-10-CM

## 2021-12-31 DIAGNOSIS — E559 Vitamin D deficiency, unspecified: Secondary | ICD-10-CM

## 2021-12-31 DIAGNOSIS — I10 Essential (primary) hypertension: Secondary | ICD-10-CM | POA: Diagnosis not present

## 2021-12-31 DIAGNOSIS — E782 Mixed hyperlipidemia: Secondary | ICD-10-CM

## 2021-12-31 LAB — BAYER DCA HB A1C WAIVED: HB A1C (BAYER DCA - WAIVED): 6.1 % — ABNORMAL HIGH (ref 4.8–5.6)

## 2021-12-31 MED ORDER — HYDROCHLOROTHIAZIDE 12.5 MG PO CAPS: 12.5000 mg | ORAL_CAPSULE | Freq: Every day | ORAL | 1 refills | Status: DC

## 2021-12-31 MED ORDER — LISINOPRIL 40 MG PO TABS: 40.0000 mg | ORAL_TABLET | Freq: Every day | ORAL | 1 refills | Status: DC

## 2021-12-31 MED ORDER — ATORVASTATIN CALCIUM 10 MG PO TABS: 10.0000 mg | ORAL_TABLET | Freq: Every day | ORAL | 1 refills | Status: DC

## 2021-12-31 NOTE — Progress Notes (Signed)
Assessment & Plan:  1. Prediabetes - Bayer DCA Hb A1c Waived, 6.1 today  2. Essential hypertension - Well controlled on current regimen - Lipid panel - CBC with Differential/Platelet - CMP14+EGFR - hydrochlorothiazide (MICROZIDE) 12.5 MG capsule; Take 1 capsule (12.5 mg total) by mouth daily.  Dispense: 90 capsule; Refill: 1 - lisinopril (ZESTRIL) 40 MG tablet; Take 1 tablet (40 mg total) by mouth daily.  Dispense: 90 tablet; Refill: 1  3. Mixed hyperlipidemia -Well controlled on current regimen - Lipid panel - CMP14+EGFR - atorvastatin (LIPITOR) 10 MG tablet; Take 1 tablet (10 mg total) by mouth daily.  Dispense: 90 tablet; Refill: 1  4. Arthritis Well controlled on current regimen.  - CMP14+EGFR  5. Vitamin D deficiency Labs to assess. Will switch to daily supplement if levels are normal now. - VITAMIN D 25 Hydroxy (Vit-D Deficiency, Fractures)   Return in about 6 months (around 06/30/2022) for annual physical.  Lucile Crater, NP Student  I personally was present during the history, physical exam, and medical decision-making activities of this service and have verified that the service and findings are accurately documented in the nurse practitioner student's note.  [name], MSN, APRN, FNP-C [facility]  Subjective:    Patient ID: Kara Mathis, female    DOB: 1961/01/07, 61 y.o.   MRN: [id number]  Patient Care Team: [name], FNP as PCP - General (Family Medicine) Okey Regal, [place] (Optometry) Gala Romney, Cristopher Estimable, MD as Consulting Physician (Gastroenterology)   Chief Complaint:  Chief Complaint  Patient presents with   Diabetes   Hypertension    6 month follow up of chronic medical conditions     HPI: Kara Mathis is a 61 y.o. female presenting on 12/31/2021 for Diabetes and Hypertension (6 month follow up of chronic medical conditions )  Hypertension: controlled with lisinopril 40 mg daily. She states she does try to take her  blood pressure at home occasionally but gets inconsistent readings, so she is not sure if it works correctly or not. She states she is very active at work but does not get exercise outside of that due to having to help with her mom.   Prediabetes: controlled with diet.   Hyperlipidemia: controlled with atorvastatin.  The 10-year ASCVD risk score (Arnett DK, et al., 2019) is: 9.3%   Values used to calculate the score:     Age: 64 years     Sex: Female     Is Non-Hispanic African American: No     Diabetic: Yes     Tobacco smoker: No     Systolic Blood Pressure: 211 mmHg     Is BP treated: Yes     HDL Cholesterol: 49 mg/dL     Total Cholesterol: 157 mg/dL  Vitamin D deficiency: taking vitamin D supplement 50,000 units weekly.   New complaints: None  Social history:  Relevant past medical, surgical, family and social history reviewed and updated as indicated. Interim medical history since our last visit reviewed.  Allergies and medications reviewed and updated.  DATA REVIEWED: CHART IN EPIC  ROS: Negative unless specifically indicated above in HPI.    Current Outpatient Medications:    diclofenac (VOLTAREN) 75 MG EC tablet, TAKE ONE (1) TABLET BY MOUTH TWO (2) TIMES DAILY, Disp: 180 tablet, Rfl: 1   Vitamin D, Ergocalciferol, (DRISDOL) 1.25 MG (50000 UNIT) CAPS capsule, TAKE 1 CAPSULE BY MOUTH EVERY 7 DAYS, Disp: 12 capsule, Rfl: 0   atorvastatin (LIPITOR) 10 MG tablet, Take  1 tablet (10 mg total) by mouth daily., Disp: 90 tablet, Rfl: 1   hydrochlorothiazide (MICROZIDE) 12.5 MG capsule, Take 1 capsule (12.5 mg total) by mouth daily., Disp: 90 capsule, Rfl: 1   lisinopril (ZESTRIL) 40 MG tablet, Take 1 tablet (40 mg total) by mouth daily., Disp: 90 tablet, Rfl: 1   Allergies  Allergen Reactions   Asa [Aspirin]     Shaky and dizzy    Aleve [Naproxen] Palpitations   Past Medical History:  Diagnosis Date   Arthritis    Hyperlipidemia    Hypertension    Prediabetes     Type 2 diabetes mellitus ([place]) 07/03/2020   A1c 6.8   Uterine fibroid    Vitamin D deficiency     Past Surgical History:  Procedure Laterality Date   COLONOSCOPY WITH PROPOFOL N/A 05/02/2021   Procedure: COLONOSCOPY WITH PROPOFOL;  Surgeon: Eloise Harman, DO;  [facility];  Service: Endoscopy;  Laterality: N/A;  ASA II / PM procedure   MOUTH SURGERY     POLYPECTOMY  05/02/2021   Procedure: POLYPECTOMY;  Surgeon: Eloise Harman, DO;  [facility];  Service: Endoscopy;;   TUBAL LIGATION  07/02/1986    Social History   Socioeconomic History   Marital status: Widowed    Spouse name: Not on file   Number of children: Not on file   Years of education: Not on file   Highest education level: Not on file  Occupational History   Not on file  Tobacco Use   Smoking status: Never   Smokeless tobacco: Never  Vaping Use   Vaping Use: Never used  Substance and Sexual Activity   Alcohol use: Never   Drug use: Never   Sexual activity: Not on file  Other Topics Concern   Not on file  Social History Narrative   Not on file   Social Determinants of Health   Financial Resource Strain: Not on file  Food Insecurity: Not on file  Transportation Needs: Not on file  Physical Activity: Not on file  Stress: Not on file  Social Connections: Not on file  Intimate Partner Violence: Not on file        Objective:    BP 135/82    Pulse 63    Temp 97.9 F (36.6 C) (Temporal)    Ht $R'5\' 10"'eG$  (1.778 m)    Wt 91.9 kg    LMP 03/17/2017    SpO2 100%    BMI 29.07 kg/m   Wt Readings from Last 3 Encounters:  12/31/21 202 lb 9.6 oz (91.9 kg)  07/02/21 199 lb 12.8 oz (90.6 kg)  05/02/21 202 lb (91.6 kg)    Physical Exam Vitals reviewed.  Constitutional:      General: She is not in acute distress.    Appearance: Normal appearance. She is overweight. She is not ill-appearing, toxic-appearing or diaphoretic.  HENT:     Head: Normocephalic and atraumatic.     Nose: Nose  normal.     Mouth/Throat:     Mouth: Mucous membranes are moist.     Pharynx: Oropharynx is clear.  Eyes:     Extraocular Movements: Extraocular movements intact.     Conjunctiva/sclera: Conjunctivae normal.     Pupils: Pupils are equal, round, and reactive to light.  Cardiovascular:     Rate and Rhythm: Normal rate and regular rhythm.     Pulses: Normal pulses.     Heart sounds: Normal heart sounds.  Pulmonary:  Effort: Pulmonary effort is normal. No respiratory distress.  Abdominal:     General: There is no distension.     Palpations: Abdomen is soft. There is no mass.  Musculoskeletal:        General: Normal range of motion.     Cervical back: Normal range of motion.  Skin:    General: Skin is warm and dry.  Neurological:     General: No focal deficit present.     Mental Status: She is alert and oriented to person, place, and time.     Motor: No weakness.     Gait: Gait normal.  Psychiatric:        Mood and Affect: Mood normal.        Behavior: Behavior normal.        Thought Content: Thought content normal.        Judgment: Judgment normal.   No results found for: TSH Lab Results  Component Value Date   WBC 4.9 07/02/2021   HGB 13.0 07/02/2021   HCT 38.8 07/02/2021   MCV 85 07/02/2021   PLT 183 07/02/2021   Lab Results  Component Value Date   NA 143 07/02/2021   K 4.8 07/02/2021   CO2 25 07/02/2021   GLUCOSE 93 07/02/2021   BUN 17 07/02/2021   CREATININE 0.81 07/02/2021   BILITOT 0.3 07/02/2021   ALKPHOS 61 07/02/2021   AST 11 07/02/2021   ALT 18 07/02/2021   PROT 6.7 07/02/2021   ALBUMIN 4.4 07/02/2021   CALCIUM 9.1 07/02/2021   ANIONGAP 6 04/30/2021   EGFR 83 07/02/2021   Lab Results  Component Value Date   CHOL 157 07/02/2021   Lab Results  Component Value Date   HDL 49 07/02/2021   Lab Results  Component Value Date   LDLCALC 87 07/02/2021   Lab Results  Component Value Date   TRIG 119 07/02/2021   Lab Results  Component Value  Date   CHOLHDL 3.2 07/02/2021   Lab Results  Component Value Date   HGBA1C 6.0 07/02/2021

## 2022-01-01 LAB — CMP14+EGFR
ALT: 16 IU/L (ref 0–32)
AST: 16 IU/L (ref 0–40)
Albumin/Globulin Ratio: 2 (ref 1.2–2.2)
Albumin: 4.4 g/dL (ref 3.8–4.8)
Alkaline Phosphatase: 60 IU/L (ref 44–121)
BUN/Creatinine Ratio: 28 (ref 12–28)
BUN: 25 mg/dL (ref 8–27)
Bilirubin Total: 0.4 mg/dL (ref 0.0–1.2)
CO2: 25 mmol/L (ref 20–29)
Calcium: 9.4 mg/dL (ref 8.7–10.3)
Chloride: 102 mmol/L (ref 96–106)
Creatinine, Ser: 0.89 mg/dL (ref 0.57–1.00)
Globulin, Total: 2.2 g/dL (ref 1.5–4.5)
Glucose: 98 mg/dL (ref 70–99)
Potassium: 4.6 mmol/L (ref 3.5–5.2)
Sodium: 141 mmol/L (ref 134–144)
Total Protein: 6.6 g/dL (ref 6.0–8.5)
eGFR: 74 mL/min/{1.73_m2} (ref >59)

## 2022-01-01 LAB — CBC WITH DIFFERENTIAL/PLATELET
Basophils Absolute: 0 10*3/uL (ref 0.0–0.2)
Basos: 1 %
EOS (ABSOLUTE): 0.1 10*3/uL (ref 0.0–0.4)
Eos: 2 %
Hematocrit: 39.4 % (ref 34.0–46.6)
Hemoglobin: 12.9 g/dL (ref 11.1–15.9)
Immature Grans (Abs): 0 10*3/uL (ref 0.0–0.1)
Immature Granulocytes: 0 %
Lymphocytes Absolute: 1.7 10*3/uL (ref 0.7–3.1)
Lymphs: 40 %
MCH: 28.1 pg (ref 26.6–33.0)
MCHC: 32.7 g/dL (ref 31.5–35.7)
MCV: 86 fL (ref 79–97)
Monocytes Absolute: 0.3 10*3/uL (ref 0.1–0.9)
Monocytes: 7 %
Neutrophils Absolute: 2.2 10*3/uL (ref 1.4–7.0)
Neutrophils: 50 %
Platelets: 162 10*3/uL (ref 150–450)
RBC: 4.59 x10E6/uL (ref 3.77–5.28)
RDW: 13.1 % (ref 11.7–15.4)
WBC: 4.3 10*3/uL (ref 3.4–10.8)

## 2022-01-01 LAB — LIPID PANEL
Chol/HDL Ratio: 3.3 ratio (ref 0.0–4.4)
Cholesterol, Total: 167 mg/dL (ref 100–199)
HDL: 51 mg/dL (ref >39)
LDL Chol Calc (NIH): 98 mg/dL (ref 0–99)
Triglycerides: 100 mg/dL (ref 0–149)
VLDL Cholesterol Cal: 18 mg/dL (ref 5–40)

## 2022-01-01 LAB — VITAMIN D 25 HYDROXY (VIT D DEFICIENCY, FRACTURES): Vit D, 25-Hydroxy: 46.4 ng/mL (ref 30.0–100.0)

## 2022-02-17 ENCOUNTER — Other Ambulatory Visit: Payer: Self-pay | Admitting: Family Medicine

## 2022-02-17 DIAGNOSIS — M1990 Unspecified osteoarthritis, unspecified site: Secondary | ICD-10-CM

## 2022-05-28 ENCOUNTER — Other Ambulatory Visit: Payer: Self-pay | Admitting: Family Medicine

## 2022-05-28 DIAGNOSIS — M1990 Unspecified osteoarthritis, unspecified site: Secondary | ICD-10-CM

## 2022-07-01 NOTE — Patient Instructions (Signed)
Our records indicate that you are due for your annual mammogram/breast imaging. While there is no way to prevent breast cancer, early detection provides the best opportunity for curing it. For women over the age of 40, the American Cancer Society recommends a yearly clinical breast exam and a yearly mammogram. These practices have saved thousands of lives. We need your help to ensure that you are receiving optimal medical care. Please call the imaging location that has done you previous mammograms. Please remember to list us as your primary care. This helps make sure we receive a report and can update your chart.  Below is the contact information for several local breast imaging centers. You may call the location that works best for you, and they will be happy to assistance in making you an appointment. You do not need an order for a regular screening mammogram. However, if you are having any problems or concerns with you breast area, please let your primary care provider know, and appropriate orders will be placed. Please let our office know if you have any questions or concerns. Or if you need information for another imaging center not on this list or outside of the area. We are commented to working with you on your health care journey.   The mobile unit/bus ([facility]) - they come twice a month to our location.  These appointments can be made through our office or by call The Breast Center  [facility]  [address] [place], [place] [address] Phone [phone]  [facility] Radiology Department  [address]  [place], [place] [address] [phone]  [facility])  [address] [place], [place] [address] [phone]  [facility] [place]  [address] [place] [address] [phone]  [facility]  [address], [address] [place] [address] [phone]  [facility] [place]  [address] [place], [place] [address] [phone]  [facility] [address] [place],  [address] [phone]  [facility] [address]  [address] [place],  [address] [phone]  [facility] [address] [place] [address] [phone]     

## 2022-07-02 ENCOUNTER — Encounter: Payer: Self-pay | Admitting: Family Medicine

## 2022-07-02 ENCOUNTER — Ambulatory Visit (INDEPENDENT_AMBULATORY_CARE_PROVIDER_SITE_OTHER): Payer: Commercial Managed Care - PPO | Admitting: Family Medicine

## 2022-07-02 VITALS — BP 128/65 | HR 61 | Temp 97.6°F | Ht 70.0 in | Wt 199.2 lb

## 2022-07-02 DIAGNOSIS — Z0001 Encounter for general adult medical examination with abnormal findings: Secondary | ICD-10-CM

## 2022-07-02 DIAGNOSIS — M1990 Unspecified osteoarthritis, unspecified site: Secondary | ICD-10-CM

## 2022-07-02 DIAGNOSIS — R7303 Prediabetes: Secondary | ICD-10-CM

## 2022-07-02 DIAGNOSIS — I10 Essential (primary) hypertension: Secondary | ICD-10-CM | POA: Diagnosis not present

## 2022-07-02 DIAGNOSIS — E559 Vitamin D deficiency, unspecified: Secondary | ICD-10-CM

## 2022-07-02 DIAGNOSIS — E782 Mixed hyperlipidemia: Secondary | ICD-10-CM

## 2022-07-02 DIAGNOSIS — Z0000 Encounter for general adult medical examination without abnormal findings: Secondary | ICD-10-CM

## 2022-07-02 LAB — CMP14+EGFR
ALT: 12 IU/L (ref 0–32)
AST: 10 IU/L (ref 0–40)
Albumin/Globulin Ratio: 2.1 (ref 1.2–2.2)
Albumin: 4.6 g/dL (ref 3.8–4.8)
Alkaline Phosphatase: 49 IU/L (ref 44–121)
BUN/Creatinine Ratio: 22 (ref 12–28)
BUN: 20 mg/dL (ref 8–27)
Bilirubin Total: 0.4 mg/dL (ref 0.0–1.2)
CO2: 25 mmol/L (ref 20–29)
Calcium: 9.6 mg/dL (ref 8.7–10.3)
Chloride: 105 mmol/L (ref 96–106)
Creatinine, Ser: 0.91 mg/dL (ref 0.57–1.00)
Globulin, Total: 2.2 g/dL (ref 1.5–4.5)
Glucose: 115 mg/dL — ABNORMAL HIGH (ref 70–99)
Potassium: 4.5 mmol/L (ref 3.5–5.2)
Sodium: 141 mmol/L (ref 134–144)
Total Protein: 6.8 g/dL (ref 6.0–8.5)
eGFR: 72 mL/min/{1.73_m2} (ref >59)

## 2022-07-02 LAB — CBC WITH DIFFERENTIAL/PLATELET
Basophils Absolute: 0 10*3/uL (ref 0.0–0.2)
Basos: 0 %
EOS (ABSOLUTE): 0.1 10*3/uL (ref 0.0–0.4)
Eos: 2 %
Hematocrit: 35.8 % (ref 34.0–46.6)
Hemoglobin: 11.6 g/dL (ref 11.1–15.9)
Immature Grans (Abs): 0 10*3/uL (ref 0.0–0.1)
Immature Granulocytes: 0 %
Lymphocytes Absolute: 1.7 10*3/uL (ref 0.7–3.1)
Lymphs: 36 %
MCH: 28 pg (ref 26.6–33.0)
MCHC: 32.4 g/dL (ref 31.5–35.7)
MCV: 87 fL (ref 79–97)
Monocytes Absolute: 0.4 10*3/uL (ref 0.1–0.9)
Monocytes: 8 %
Neutrophils Absolute: 2.5 10*3/uL (ref 1.4–7.0)
Neutrophils: 54 %
Platelets: 171 10*3/uL (ref 150–450)
RBC: 4.14 x10E6/uL (ref 3.77–5.28)
RDW: 12.9 % (ref 11.7–15.4)
WBC: 4.6 10*3/uL (ref 3.4–10.8)

## 2022-07-02 LAB — LIPID PANEL
Chol/HDL Ratio: 3.5 ratio (ref 0.0–4.4)
Cholesterol, Total: 159 mg/dL (ref 100–199)
HDL: 45 mg/dL (ref >39)
LDL Chol Calc (NIH): 94 mg/dL (ref 0–99)
Triglycerides: 113 mg/dL (ref 0–149)
VLDL Cholesterol Cal: 20 mg/dL (ref 5–40)

## 2022-07-02 LAB — BAYER DCA HB A1C WAIVED: HB A1C (BAYER DCA - WAIVED): 5.9 % — ABNORMAL HIGH (ref 4.8–5.6)

## 2022-07-02 MED ORDER — HYDROCHLOROTHIAZIDE 12.5 MG PO CAPS: 12.5000 mg | ORAL_CAPSULE | Freq: Every day | ORAL | 1 refills | Status: DC

## 2022-07-02 MED ORDER — ATORVASTATIN CALCIUM 10 MG PO TABS: 10.0000 mg | ORAL_TABLET | Freq: Every day | ORAL | 1 refills | Status: DC

## 2022-07-02 MED ORDER — LISINOPRIL 40 MG PO TABS: 40.0000 mg | ORAL_TABLET | Freq: Every day | ORAL | 1 refills | Status: DC

## 2022-07-02 NOTE — Progress Notes (Signed)
Assessment & Plan:  Well adult exam Discussed health benefits of physical activity, and encouraged her to engage in regular exercise appropriate for her age and condition. Preventive health education provided. Declined HIV/Hep C screening, COVID booster, Shingrix, TDaP, Prevnar 20, and mammogram.  Immunization History  Administered Date(s) Administered   Influenza,inj,Quad PF,6+ Mos 10/09/2020, 09/17/2021   Moderna Sars-Covid-2 Vaccination 03/20/2020, 04/17/2020, 11/06/2020   Health Maintenance  Topic Date Due   COVID-19 Vaccine (4 - Moderna series) 07/18/2022 (Originally 01/01/2021)   Zoster Vaccines- Shingrix (1 of 2) 10/02/2022 (Originally 12/04/2010)   MAMMOGRAM  12/31/2022 (Originally 04/01/2012)   TETANUS/TDAP  12/31/2022 (Originally 12/05/1979)   Hepatitis C Screening  12/31/2022 (Originally 12/04/1978)   HIV Screening  12/31/2022 (Originally 12/05/1975)   INFLUENZA VACCINE  07/28/2022   COLONOSCOPY (Pts 45-55yr Insurance coverage will need to be confirmed)  05/02/2024   PAP SMEAR-Modifier  07/02/2024   HPV VACCINES  Aged Out   2. Essential hypertension Well controlled on current regimen.  - CBC with Differential/Platelet - CMP14+EGFR - hydrochlorothiazide (MICROZIDE) 12.5 MG capsule; Take 1 capsule (12.5 mg total) by mouth daily.  Dispense: 90 capsule; Refill: 1 - lisinopril (ZESTRIL) 40 MG tablet; Take 1 tablet (40 mg total) by mouth daily.  Dispense: 90 tablet; Refill: 1  3. Mixed hyperlipidemia Well controlled on current regimen.  - Lipid panel - atorvastatin (LIPITOR) 10 MG tablet; Take 1 tablet (10 mg total) by mouth daily.  Dispense: 90 tablet; Refill: 1  4. Prediabetes Diet controlled. - Bayer DCA Hb A1c Waived  5. Vitamin D deficiency Patient did make the switch from the weekly 50,000 units to daily 1,000 units.  6. Arthritis Controlled with OTC medications.   Follow-up: Return in about 6 months (around 01/02/2023) for follow-up of chronic medication  conditions.   BHendricks Limes MSN, APRN, FNP-C [facility]  Subjective:  Patient ID: VLatroya Ng female    DOB: [birth year] Age: 61y.o. MRN: [id number] Patient Care Team: JLoman Brooklyn FNP as PCP - General (Family Medicine) MOkey Regal [place](Optometry) RGala Romney RCristopher Estimable MD as Consulting Physician (Gastroenterology)   CC:  Chief Complaint  Patient presents with   Annual Exam    HPI VKiameshaMPerrowis a 61y.o. female who presents today for a complete physical exam. She reports consuming a  low sodium  diet; she is intermittent fasting. The patient does not participate in regular exercise at present. She generally feels well. She reports sleeping well. She does not have additional problems to discuss today.   Vision:Not within last year Dental:Receives regular dental care  DEPRESSION SCREENING    07/02/2022    9:24 AM 12/31/2021   10:33 AM 07/02/2021   11:28 AM 03/06/2021    8:36 AM 11/28/2020    1:59 PM 10/09/2020    8:25 AM 08/28/2020    9:44 AM  PHQ 2/9 Scores  PHQ - 2 Score 0 2 0 0 0 0 0  PHQ- 9 Score 2 4 2         Hypertension: controlled with lisinopril 40 mg daily. She states she does try to take her blood pressure at home occasionally. She states she is very active at work but does not get exercise outside of that due to having to help with her mom.    Prediabetes: controlled with diet.    Hyperlipidemia: controlled with atorvastatin.   The 10-year ASCVD risk score (Arnett DK, et al., 2019) is: 8.6%   Values used to calculate  the score:     Age: 61 years     Sex: Female     Is Non-Hispanic African American: No     Diabetic: Yes     Tobacco smoker: No     Systolic Blood Pressure: 086 mmHg     Is BP treated: Yes     HDL Cholesterol: 51 mg/dL     Total Cholesterol: 167 mg/dL   Vitamin D deficiency: taking vitamin D3 1,000 units daily. Last vitamin D level normal in January 2023.   Review of Systems  Constitutional:  Negative for chills,  fever, malaise/fatigue and weight loss.  HENT:  Negative for congestion, ear discharge, ear pain, nosebleeds, sinus pain, sore throat and tinnitus.   Eyes:  Negative for blurred vision, double vision, pain, discharge and redness.  Respiratory:  Negative for cough, shortness of breath and wheezing.   Cardiovascular:  Negative for chest pain, palpitations and leg swelling.  Gastrointestinal:  Negative for abdominal pain, constipation, diarrhea, heartburn, nausea and vomiting.  Genitourinary:  Negative for dysuria, frequency and urgency.  Musculoskeletal:  Positive for joint pain. Negative for myalgias.  Skin:  Negative for rash.  Neurological:  Negative for dizziness, seizures, weakness and headaches.  Psychiatric/Behavioral:  Negative for depression, substance abuse and suicidal ideas. The patient is not nervous/anxious.      Current Outpatient Medications:    atorvastatin (LIPITOR) 10 MG tablet, Take 1 tablet (10 mg total) by mouth daily., Disp: 90 tablet, Rfl: 1   diclofenac (VOLTAREN) 75 MG EC tablet, Take 1 tablet (75 mg total) by mouth 2 (two) times daily., Disp: 180 tablet, Rfl: 1   hydrochlorothiazide (MICROZIDE) 12.5 MG capsule, Take 1 capsule (12.5 mg total) by mouth daily., Disp: 90 capsule, Rfl: 1   lisinopril (ZESTRIL) 40 MG tablet, Take 1 tablet (40 mg total) by mouth daily., Disp: 90 tablet, Rfl: 1   Vitamin D, Ergocalciferol, (DRISDOL) 1.25 MG (50000 UNIT) CAPS capsule, TAKE 1 CAPSULE BY MOUTH EVERY 7 DAYS, Disp: 12 capsule, Rfl: 0  Allergies  Allergen Reactions   Asa [Aspirin]     Shaky and dizzy    Aleve [Naproxen] Palpitations    Past Medical History:  Diagnosis Date   Arthritis    Hyperlipidemia    Hypertension    Prediabetes    Type 2 diabetes mellitus ([place]) 07/03/2020   A1c 6.8   Uterine fibroid    Vitamin D deficiency     Past Surgical History:  Procedure Laterality Date   COLONOSCOPY WITH PROPOFOL N/A 05/02/2021   Procedure: COLONOSCOPY WITH PROPOFOL;   Surgeon: Eloise Harman, DO;  [facility];  Service: Endoscopy;  Laterality: N/A;  ASA II / PM procedure   MOUTH SURGERY     POLYPECTOMY  05/02/2021   Procedure: POLYPECTOMY;  Surgeon: Eloise Harman, DO;  [facility];  Service: Endoscopy;;   TUBAL LIGATION  07/02/1986    Family History  Problem Relation Age of Onset   Heart attack Father     Social History   Socioeconomic History   Marital status: Widowed    Spouse name: Not on file   Number of children: Not on file   Years of education: Not on file   Highest education level: Not on file  Occupational History   Not on file  Tobacco Use   Smoking status: Never   Smokeless tobacco: Never  Vaping Use   Vaping Use: Never used  Substance and Sexual Activity   Alcohol use:  Never   Drug use: Never   Sexual activity: Not on file  Other Topics Concern   Not on file  Social History Narrative   Not on file   Social Determinants of Health   Financial Resource Strain: Not on file  Food Insecurity: Not on file  Transportation Needs: Not on file  Physical Activity: Not on file  Stress: Not on file  Social Connections: Not on file  Intimate Partner Violence: Not on file      Objective:    BP 128/65   Pulse 61   Temp 97.6 F (36.4 C) (Temporal)   Ht 5' 10"  (1.778 m)   Wt 199 lb 3.2 oz (90.4 kg)   LMP 03/17/2017   SpO2 100%   BMI 28.58 kg/m   BP Readings from Last 3 Encounters:  07/02/22 128/65  12/31/21 135/82  07/02/21 131/70   Wt Readings from Last 3 Encounters:  07/02/22 199 lb 3.2 oz (90.4 kg)  12/31/21 202 lb 9.6 oz (91.9 kg)  07/02/21 199 lb 12.8 oz (90.6 kg)    Physical Exam Vitals reviewed.  Constitutional:      General: She is not in acute distress.    Appearance: Normal appearance. She is overweight. She is not ill-appearing, toxic-appearing or diaphoretic.  HENT:     Head: Normocephalic and atraumatic.     Right Ear: Tympanic membrane, ear canal and external ear  normal. There is no impacted cerumen.     Left Ear: Tympanic membrane, ear canal and external ear normal. There is no impacted cerumen.     Nose: Nose normal. No congestion or rhinorrhea.     Mouth/Throat:     Mouth: Mucous membranes are moist.     Pharynx: Oropharynx is clear. No oropharyngeal exudate or posterior oropharyngeal erythema.  Eyes:     General: No scleral icterus.       Right eye: No discharge.        Left eye: No discharge.     Conjunctiva/sclera: Conjunctivae normal.     Pupils: Pupils are equal, round, and reactive to light.  Cardiovascular:     Rate and Rhythm: Normal rate and regular rhythm.     Heart sounds: Normal heart sounds. No murmur heard.    No friction rub. No gallop.  Pulmonary:     Effort: Pulmonary effort is normal. No respiratory distress.     Breath sounds: Normal breath sounds. No stridor. No wheezing, rhonchi or rales.  Abdominal:     General: Abdomen is flat. Bowel sounds are normal. There is no distension.     Palpations: Abdomen is soft. There is no hepatomegaly, splenomegaly or mass.     Tenderness: There is no abdominal tenderness. There is no guarding or rebound.     Hernia: No hernia is present.  Musculoskeletal:        General: Normal range of motion.     Cervical back: Normal range of motion and neck supple. No rigidity. No muscular tenderness.  Lymphadenopathy:     Cervical: No cervical adenopathy.  Skin:    General: Skin is warm and dry.     Capillary Refill: Capillary refill takes less than 2 seconds.  Neurological:     General: No focal deficit present.     Mental Status: She is alert and oriented to person, place, and time. Mental status is at baseline.  Psychiatric:        Mood and Affect: Mood normal.  Behavior: Behavior normal.        Thought Content: Thought content normal.        Judgment: Judgment normal.     No results found for: "TSH" Lab Results  Component Value Date   WBC 4.3 12/31/2021   HGB 12.9  12/31/2021   HCT 39.4 12/31/2021   MCV 86 12/31/2021   PLT 162 12/31/2021   Lab Results  Component Value Date   NA 141 12/31/2021   K 4.6 12/31/2021   CO2 25 12/31/2021   GLUCOSE 98 12/31/2021   BUN 25 12/31/2021   CREATININE 0.89 12/31/2021   BILITOT 0.4 12/31/2021   ALKPHOS 60 12/31/2021   AST 16 12/31/2021   ALT 16 12/31/2021   PROT 6.6 12/31/2021   ALBUMIN 4.4 12/31/2021   CALCIUM 9.4 12/31/2021   ANIONGAP 6 04/30/2021   EGFR 74 12/31/2021   Lab Results  Component Value Date   CHOL 167 12/31/2021   Lab Results  Component Value Date   HDL 51 12/31/2021   Lab Results  Component Value Date   LDLCALC 98 12/31/2021   Lab Results  Component Value Date   TRIG 100 12/31/2021   Lab Results  Component Value Date   CHOLHDL 3.3 12/31/2021   Lab Results  Component Value Date   HGBA1C 6.1 (H) 12/31/2021

## 2022-10-28 ENCOUNTER — Ambulatory Visit (INDEPENDENT_AMBULATORY_CARE_PROVIDER_SITE_OTHER): Payer: Commercial Managed Care - PPO

## 2022-10-28 DIAGNOSIS — Z23 Encounter for immunization: Secondary | ICD-10-CM | POA: Diagnosis not present

## 2023-01-06 ENCOUNTER — Encounter: Payer: Self-pay | Admitting: Family Medicine

## 2023-01-06 ENCOUNTER — Ambulatory Visit: Payer: Commercial Managed Care - PPO | Admitting: Family Medicine

## 2023-01-06 ENCOUNTER — Ambulatory Visit (INDEPENDENT_AMBULATORY_CARE_PROVIDER_SITE_OTHER): Payer: Commercial Managed Care - PPO | Admitting: Family Medicine

## 2023-01-06 VITALS — BP 113/67 | HR 74 | Temp 98.3°F | Ht 70.0 in | Wt 201.5 lb

## 2023-01-06 DIAGNOSIS — R7303 Prediabetes: Secondary | ICD-10-CM

## 2023-01-06 DIAGNOSIS — E782 Mixed hyperlipidemia: Secondary | ICD-10-CM

## 2023-01-06 DIAGNOSIS — I10 Essential (primary) hypertension: Secondary | ICD-10-CM | POA: Diagnosis not present

## 2023-01-06 DIAGNOSIS — M1990 Unspecified osteoarthritis, unspecified site: Secondary | ICD-10-CM | POA: Diagnosis not present

## 2023-01-06 LAB — BAYER DCA HB A1C WAIVED: HB A1C (BAYER DCA - WAIVED): 6.3 % — ABNORMAL HIGH (ref 4.8–5.6)

## 2023-01-06 NOTE — Progress Notes (Signed)
   Established Patient Office Visit  Subjective   Patient ID: Kara Mathis, female    DOB: 1961/09/09  Age: 62 y.o. MRN: [id number]  Chief Complaint  Patient presents with   Medical Management of Chronic Issues   Hypertension   Hyperlipidemia   Prediabetes    Hypertension  Hyperlipidemia   Prediabetes She is following DASH diet and low carb. She is active throughout the day.  2. HTN Complaint with meds - Yes Current Medications - lisinpril 40 mg, HCTC 12.5 mg Checking BP at home - no Pertinent ROS:  Headache - No Fatigue - No Visual Disturbances - No Chest pain - No Dyspnea - No Palpitations - No LE edema - No  3. HLD On atorvastatin. Last LDL was 94  4. Arthritis  Reports in bilateral knees, feet, and hands. She has been taking voltaren BID with good relief.     ROS As per HPI.    Objective:     BP 113/67   Pulse 74   Temp 98.3 F (36.8 C) (Temporal)   Ht '5\' 10"'$  (1.778 m)   Wt 201 lb 8 oz (91.4 kg)   LMP 03/17/2017   SpO2 96%   BMI 28.91 kg/m    Physical Exam Vitals and nursing note reviewed.  Constitutional:      General: She is not in acute distress.    Appearance: She is not ill-appearing, toxic-appearing or diaphoretic.  Neck:     Thyroid: No thyroid mass, thyromegaly or thyroid tenderness.     Vascular: No carotid bruit.  Cardiovascular:     Rate and Rhythm: Normal rate and regular rhythm.     Heart sounds: Normal heart sounds. No murmur heard. Pulmonary:     Effort: Pulmonary effort is normal. No respiratory distress.     Breath sounds: Normal breath sounds.  Abdominal:     General: Bowel sounds are normal. There is no distension.     Palpations: Abdomen is soft.     Tenderness: There is no abdominal tenderness. There is no guarding or rebound.  Skin:    General: Skin is warm and dry.  Neurological:     General: No focal deficit present.     Mental Status: She is alert and oriented to person, place, and time.  Psychiatric:         Mood and Affect: Mood normal.        Behavior: Behavior normal.      No results found for any visits on 01/06/23.    The 10-year ASCVD risk score (Arnett DK, et al., 2019) is: 7.8%    Assessment & Plan:   Kara Mathis was seen today for medical management of chronic issues, hypertension, hyperlipidemia and prediabetes.  Diagnoses and all orders for this visit:  Prediabetes Diet controlled. A1c pending.  -     Bayer DCA Hb A1c Waived  Primary hypertension Well controlled on current regimen. Will check TSH today.  -     TSH  Mixed hyperlipidemia Well controlled with atorvastatin and diet.   Arthritis Well controlled on current regimen. Continue voltaren.    Return in about 6 months (around 07/07/2023) for CPE.   The patient indicates understanding of these issues and agrees with the plan.   Gwenlyn Perking, FNP

## 2023-01-07 ENCOUNTER — Ambulatory Visit (INDEPENDENT_AMBULATORY_CARE_PROVIDER_SITE_OTHER): Payer: Commercial Managed Care - PPO | Admitting: *Deleted

## 2023-01-07 ENCOUNTER — Other Ambulatory Visit: Payer: Self-pay | Admitting: Family Medicine

## 2023-01-07 DIAGNOSIS — Z23 Encounter for immunization: Secondary | ICD-10-CM | POA: Diagnosis not present

## 2023-01-07 DIAGNOSIS — M1990 Unspecified osteoarthritis, unspecified site: Secondary | ICD-10-CM

## 2023-01-07 LAB — TSH: TSH: 0.785 u[IU]/mL (ref 0.450–4.500)

## 2023-01-07 NOTE — Progress Notes (Signed)
Tdap vaccine given. Left deltoid, intramuscular patient tolerated well

## 2023-02-09 ENCOUNTER — Other Ambulatory Visit: Payer: Self-pay | Admitting: Family Medicine

## 2023-02-09 DIAGNOSIS — I10 Essential (primary) hypertension: Secondary | ICD-10-CM

## 2023-02-09 DIAGNOSIS — E782 Mixed hyperlipidemia: Secondary | ICD-10-CM

## 2023-06-28 ENCOUNTER — Other Ambulatory Visit: Payer: Self-pay | Admitting: Family Medicine

## 2023-06-28 DIAGNOSIS — E782 Mixed hyperlipidemia: Secondary | ICD-10-CM

## 2023-06-28 DIAGNOSIS — I10 Essential (primary) hypertension: Secondary | ICD-10-CM

## 2023-07-09 ENCOUNTER — Encounter: Payer: Commercial Managed Care - PPO | Admitting: Family Medicine

## 2023-08-02 ENCOUNTER — Other Ambulatory Visit: Payer: Self-pay | Admitting: Family Medicine

## 2023-08-02 DIAGNOSIS — M1990 Unspecified osteoarthritis, unspecified site: Secondary | ICD-10-CM

## 2023-09-03 ENCOUNTER — Other Ambulatory Visit: Payer: Self-pay | Admitting: Family Medicine

## 2023-09-03 DIAGNOSIS — M1990 Unspecified osteoarthritis, unspecified site: Secondary | ICD-10-CM

## 2023-10-29 ENCOUNTER — Encounter: Payer: Self-pay | Admitting: Family Medicine

## 2023-12-02 ENCOUNTER — Ambulatory Visit (INDEPENDENT_AMBULATORY_CARE_PROVIDER_SITE_OTHER): Payer: 59 | Admitting: Nurse Practitioner

## 2023-12-02 ENCOUNTER — Encounter: Payer: Self-pay | Admitting: Nurse Practitioner

## 2023-12-02 VITALS — BP 136/85 | HR 73 | Temp 97.5°F | Ht 70.0 in | Wt 232.2 lb

## 2023-12-02 DIAGNOSIS — I10 Essential (primary) hypertension: Secondary | ICD-10-CM

## 2023-12-02 DIAGNOSIS — M1990 Unspecified osteoarthritis, unspecified site: Secondary | ICD-10-CM

## 2023-12-02 DIAGNOSIS — Z23 Encounter for immunization: Secondary | ICD-10-CM | POA: Diagnosis not present

## 2023-12-02 DIAGNOSIS — D1721 Benign lipomatous neoplasm of skin and subcutaneous tissue of right arm: Secondary | ICD-10-CM | POA: Diagnosis not present

## 2023-12-02 DIAGNOSIS — Z0001 Encounter for general adult medical examination with abnormal findings: Secondary | ICD-10-CM | POA: Diagnosis not present

## 2023-12-02 DIAGNOSIS — Z0000 Encounter for general adult medical examination without abnormal findings: Secondary | ICD-10-CM | POA: Insufficient documentation

## 2023-12-02 MED ORDER — DICLOFENAC SODIUM 75 MG PO TBEC: 75.0000 mg | DELAYED_RELEASE_TABLET | Freq: Two times a day (BID) | ORAL | 0 refills | Status: DC

## 2023-12-02 MED ORDER — HYDROCHLOROTHIAZIDE 12.5 MG PO CAPS: 12.5000 mg | ORAL_CAPSULE | Freq: Every day | ORAL | 1 refills | Status: DC

## 2023-12-02 MED ORDER — LISINOPRIL 40 MG PO TABS: 40.0000 mg | ORAL_TABLET | Freq: Every day | ORAL | 1 refills | Status: DC

## 2023-12-02 NOTE — Progress Notes (Signed)
Kara Mathis is a 62 y.o. female presents to office today for annual/work physical exam examination.    Concerns today include: 1. She is working as an Tourist information centre manager with the [employer] and required a TB test. 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 , Marital status: widowed, Substance use: None Diet: Regular, Exercise: none Last eye exam: 11/06/2022 Last dental exam: 01/31/2023 Last colonoscopy: 05/02/2021 Last mammogram: 08/11/2023 ordered Last pap smear: 07/02/2022 Refills needed today: Lisinopril and Difloconac Immunizations needed:Flu administered Immunization History  Administered Date(s) Administered   Influenza, Seasonal, Injecte, Preservative Fre 12/02/2023   Influenza,inj,Quad PF,6+ Mos 10/09/2020, 09/17/2021, 10/28/2022   Moderna Sars-Covid-2 Vaccination 03/20/2020, 04/17/2020, 11/06/2020   Tdap 01/07/2023     Past Medical History:  Diagnosis Date   Arthritis    Hyperlipidemia    Hypertension    Prediabetes    Type 2 diabetes mellitus (HCC) 07/03/2020   A1c 6.8   Uterine fibroid    Vitamin D deficiency    Social History   Socioeconomic History   Marital status: Widowed    Spouse name: Not on file   Number of children: Not on file   Years of education: Not on file   Highest education level: Not on file  Occupational History   Not on file  Tobacco Use   Smoking status: Never   Smokeless tobacco: Never  Vaping Use   Vaping status: Never Used  Substance and Sexual Activity   Alcohol use: Never   Drug use: Never   Sexual activity: Not on file  Other Topics Concern   Not on file  Social History Narrative   Not on file   Social Determinants of Health   Financial Resource Strain: Not on file  Food Insecurity: Not on file  Transportation Needs: Not on file  Physical Activity: Not on file  Stress: Not on file  Social Connections: Not on file  Intimate Partner Violence: Not on file   Past Surgical History:   Procedure Laterality Date   COLONOSCOPY WITH PROPOFOL N/A 05/02/2021   Procedure: COLONOSCOPY WITH PROPOFOL;  Surgeon: Kara Bal, DO;  Location: [facility];  Service: Endoscopy;  Laterality: N/A;  ASA II / PM procedure   MOUTH SURGERY     POLYPECTOMY  05/02/2021   Procedure: POLYPECTOMY;  Surgeon: Kara Bal, DO;  Location: [facility];  Service: Endoscopy;;   TUBAL LIGATION  07/02/1986   Family History  Problem Relation Age of Onset   Heart attack Father     Current Outpatient Medications:    atorvastatin (LIPITOR) 10 MG tablet, TAKE ONE (1) TABLET BY MOUTH EVERY DAY, Disp: 90 tablet, Rfl: 1   Cholecalciferol (VITAMIN D3) 25 MCG (1000 UT) CAPS, Take 1,000 Units by mouth daily., Disp: , Rfl:    diclofenac (VOLTAREN) 75 MG EC tablet, Take 1 tablet (75 mg total) by mouth 2 (two) times daily. **NEEDS TO BE SEEN BEFORE NEXT REFILL**, Disp: 60 tablet, Rfl: 0   hydrochlorothiazide (MICROZIDE) 12.5 MG capsule, Take 1 capsule (12.5 mg total) by mouth daily., Disp: 90 capsule, Rfl: 1   lisinopril (ZESTRIL) 40 MG tablet, Take 1 tablet (40 mg total) by mouth daily., Disp: 90 tablet, Rfl: 1  Allergies  Allergen Reactions   Asa [Aspirin]     Shaky and dizzy    Aleve [Naproxen] Palpitations     ROS: Review of Systems Pertinent items are noted in HPI.   Review of Systems  Constitutional:  Negative  for chills and fever.  HENT:  Negative for congestion, hearing loss and sore throat.   Eyes:  Negative for pain.       Wear glasses  Respiratory:  Negative for cough, shortness of breath and wheezing.   Cardiovascular:  Negative for chest pain, palpitations and leg swelling.  Gastrointestinal:  Negative for constipation, diarrhea, heartburn, nausea and vomiting.  Genitourinary:  Negative for dysuria, frequency and hematuria.  Musculoskeletal:  Negative for falls and myalgias.  Skin:  Negative for itching and rash.  Neurological:  Negative for dizziness and headaches.   Endo/Heme/Allergies:  Negative for environmental allergies and polydipsia. Does not bruise/bleed easily.  Psychiatric/Behavioral:  Negative for substance abuse and suicidal ideas. The patient does not have insomnia.     Physical exam General appearance: alert, cooperative, and appears stated age Head: Normocephalic, without obvious abnormality, atraumatic Eyes: conjunctivae/corneas clear. PERRL, EOM's intact. Fundi benign. Ears: normal TM's and external ear canals both ears Nose: Nares normal. Septum midline. Mucosa normal. No drainage or sinus tenderness. Throat: lips, mucosa, and tongue normal; teeth and gums normal Neck: no adenopathy, no carotid bruit, no JVD, supple, symmetrical, trachea midline, and thyroid not enlarged, symmetric, no tenderness/mass/nodules Lungs: clear to auscultation bilaterally Heart: regular rate and rhythm, S1, S2 normal, no murmur, click, rub or gallop Abdomen: soft, non-tender; bowel sounds normal; no masses,  no organomegaly Extremities: extremities normal, atraumatic, no cyanosis or edema Skin: Skin color, texture, turgor normal. No rashes or lesions Lymph nodes: Cervical, supraclavicular, and axillary nodes normal. Neurologic: Alert and oriented X 3, normal strength and tone. Normal symmetric reflexes. Normal coordination and gait   No problem-specific Assessment & Plan notes found for this encounter.  Encounter for annual physical exam -     QuantiFERON-TB Gold Plus -     CBC with Differential/Platelet -     CMP14+EGFR -     Lipid panel -     TSH  Essential hypertension -     Lisinopril; Take 1 tablet (40 mg total) by mouth daily.  Dispense: 90 tablet; Refill: 1 -     hydroCHLOROthiazide; Take 1 capsule (12.5 mg total) by mouth daily.  Dispense: 90 capsule; Refill: 1  Arthritis -     Diclofenac Sodium; Take 1 tablet (75 mg total) by mouth 2 (two) times daily. **NEEDS TO BE SEEN BEFORE NEXT REFILL**  Dispense: 60 tablet; Refill: 0  Lipoma of  right upper extremity  Encounter for immunization -     Flu vaccine trivalent PF, 6mos and older(Flulaval,Afluria,Fluarix,Fluzone)     Assessment/ Plan: Kara Mathis 62 year old Caucasian female here for annual/work physical exam.  Lab: CBC, CMP, lipid, TSH HTN: Well-controlled with lisinopril 40 mg and HCTZ 12.5 mg, refill Arthritis: Well-controlled with diclofenac 75 mg twice a day: Refill provided QuantiFERON test ordered today  Flu vaccine administered today   Counseled on healthy lifestyle choices, including diet (rich in fruits, vegetables and lean meats and low in salt and simple carbohydrates) and exercise (at least 30 minutes of moderate physical activity daily).  Patient to follow up in 1 year for annual exam or sooner if needed.  Arrie Aran Santa Lighter, [facility] [address] [place], [place] [address] [phone]   Note: This document was prepared by [employer] voice dictation technology and any errors that results from this process are unintentional.

## 2023-12-05 ENCOUNTER — Other Ambulatory Visit: Payer: Self-pay | Admitting: Nurse Practitioner

## 2023-12-05 DIAGNOSIS — M1990 Unspecified osteoarthritis, unspecified site: Secondary | ICD-10-CM

## 2023-12-06 ENCOUNTER — Encounter: Payer: Self-pay | Admitting: Nurse Practitioner

## 2023-12-06 LAB — CMP14+EGFR
ALT: 20 [IU]/L (ref 0–32)
AST: 16 [IU]/L (ref 0–40)
Albumin: 4.4 g/dL (ref 3.9–4.9)
Alkaline Phosphatase: 77 [IU]/L (ref 44–121)
BUN/Creatinine Ratio: 18 (ref 12–28)
BUN: 26 mg/dL (ref 8–27)
Bilirubin Total: 0.2 mg/dL (ref 0.0–1.2)
CO2: 22 mmol/L (ref 20–29)
Calcium: 9 mg/dL (ref 8.7–10.3)
Chloride: 105 mmol/L (ref 96–106)
Creatinine, Ser: 1.44 mg/dL — ABNORMAL HIGH (ref 0.57–1.00)
Globulin, Total: 2.4 g/dL (ref 1.5–4.5)
Glucose: 120 mg/dL — ABNORMAL HIGH (ref 70–99)
Potassium: 5.1 mmol/L (ref 3.5–5.2)
Sodium: 143 mmol/L (ref 134–144)
Total Protein: 6.8 g/dL (ref 6.0–8.5)
eGFR: 41 mL/min/{1.73_m2} — ABNORMAL LOW (ref >59)

## 2023-12-06 LAB — LIPID PANEL
Chol/HDL Ratio: 3.6 {ratio} (ref 0.0–4.4)
Cholesterol, Total: 184 mg/dL (ref 100–199)
HDL: 51 mg/dL (ref >39)
LDL Chol Calc (NIH): 114 mg/dL — ABNORMAL HIGH (ref 0–99)
Triglycerides: 108 mg/dL (ref 0–149)
VLDL Cholesterol Cal: 19 mg/dL (ref 5–40)

## 2023-12-06 LAB — CBC WITH DIFFERENTIAL/PLATELET
Basophils Absolute: 0 10*3/uL (ref 0.0–0.2)
Basos: 0 %
EOS (ABSOLUTE): 0.1 10*3/uL (ref 0.0–0.4)
Eos: 2 %
Hematocrit: 38.3 % (ref 34.0–46.6)
Hemoglobin: 12.3 g/dL (ref 11.1–15.9)
Immature Grans (Abs): 0.1 10*3/uL (ref 0.0–0.1)
Immature Granulocytes: 1 %
Lymphocytes Absolute: 1.9 10*3/uL (ref 0.7–3.1)
Lymphs: 32 %
MCH: 29 pg (ref 26.6–33.0)
MCHC: 32.1 g/dL (ref 31.5–35.7)
MCV: 90 fL (ref 79–97)
Monocytes Absolute: 0.5 10*3/uL (ref 0.1–0.9)
Monocytes: 8 %
Neutrophils Absolute: 3.5 10*3/uL (ref 1.4–7.0)
Neutrophils: 57 %
Platelets: 195 10*3/uL (ref 150–450)
RBC: 4.24 x10E6/uL (ref 3.77–5.28)
RDW: 12.7 % (ref 11.7–15.4)
WBC: 6.1 10*3/uL (ref 3.4–10.8)

## 2023-12-06 LAB — QUANTIFERON-TB GOLD PLUS
QuantiFERON Nil Value: 0.04 [IU]/mL
QuantiFERON TB1 Ag Value: 0.06 [IU]/mL
QuantiFERON TB2 Ag Value: 0.05 [IU]/mL

## 2023-12-06 LAB — TSH: TSH: 1.33 u[IU]/mL (ref 0.450–4.500)

## 2023-12-27 ENCOUNTER — Other Ambulatory Visit: Payer: Self-pay | Admitting: Nurse Practitioner

## 2023-12-27 ENCOUNTER — Other Ambulatory Visit: Payer: Self-pay | Admitting: Family Medicine

## 2023-12-27 DIAGNOSIS — E782 Mixed hyperlipidemia: Secondary | ICD-10-CM

## 2023-12-27 DIAGNOSIS — M1990 Unspecified osteoarthritis, unspecified site: Secondary | ICD-10-CM

## 2024-05-24 ENCOUNTER — Encounter: Payer: Self-pay | Admitting: Family Medicine

## 2024-06-02 ENCOUNTER — Encounter: Payer: Self-pay | Admitting: Family Medicine

## 2024-06-02 ENCOUNTER — Ambulatory Visit: Payer: 59 | Admitting: Family Medicine

## 2024-06-02 VITALS — BP 130/73 | HR 61 | Temp 97.6°F | Ht 70.0 in | Wt 240.0 lb

## 2024-06-02 DIAGNOSIS — L853 Xerosis cutis: Secondary | ICD-10-CM

## 2024-06-02 DIAGNOSIS — I10 Essential (primary) hypertension: Secondary | ICD-10-CM | POA: Diagnosis not present

## 2024-06-02 DIAGNOSIS — E782 Mixed hyperlipidemia: Secondary | ICD-10-CM | POA: Diagnosis not present

## 2024-06-02 DIAGNOSIS — F321 Major depressive disorder, single episode, moderate: Secondary | ICD-10-CM

## 2024-06-02 DIAGNOSIS — F419 Anxiety disorder, unspecified: Secondary | ICD-10-CM

## 2024-06-02 DIAGNOSIS — E559 Vitamin D deficiency, unspecified: Secondary | ICD-10-CM | POA: Diagnosis not present

## 2024-06-02 DIAGNOSIS — R7303 Prediabetes: Secondary | ICD-10-CM | POA: Diagnosis not present

## 2024-06-02 LAB — BAYER DCA HB A1C WAIVED: HB A1C (BAYER DCA - WAIVED): 6.1 % — ABNORMAL HIGH (ref 4.8–5.6)

## 2024-06-02 MED ORDER — ATORVASTATIN CALCIUM 10 MG PO TABS: 10.0000 mg | ORAL_TABLET | Freq: Every day | ORAL | 3 refills | Status: DC

## 2024-06-02 MED ORDER — SERTRALINE HCL 25 MG PO TABS: 25.0000 mg | ORAL_TABLET | Freq: Every day | ORAL | 3 refills | Status: DC

## 2024-06-02 MED ORDER — LISINOPRIL 40 MG PO TABS: 40.0000 mg | ORAL_TABLET | Freq: Every day | ORAL | 1 refills | Status: AC

## 2024-06-02 MED ORDER — HYDROCHLOROTHIAZIDE 12.5 MG PO CAPS: 12.5000 mg | ORAL_CAPSULE | Freq: Every day | ORAL | 1 refills | Status: DC

## 2024-06-02 NOTE — Progress Notes (Signed)
 Established Patient Office Visit  Subjective   Patient ID: Kara Mathis, female    DOB: 04-15-1961  Age: 63 y.o. MRN: [id number]  Chief Complaint  Patient presents with   Medical Management of Chronic Issues    HPI  Prediabetes Regular diet. No exercise. Increased emotional eating since the passing of her mother in October and passing of her dog in February.  2. HTN Complaint with meds - Yes Current Medications - hydrochlorothiazide  12.5 mg, lisinopril  40 mg Checking BP at home ranging Pertinent ROS:  Chest pain - No Dyspnea - No Palpitations - No LE edema - No  3. Anxiety/depression Trouble sleeping, overeating, decreased motivation, crying, anxiety since the passing of her mother and dog.   4. Dry, itchy skin Skin has been dry and itchy. Using lotion with minimal relief.      06/02/2024    8:08 AM 12/02/2023   11:36 AM 01/06/2023   11:16 AM  Depression screen PHQ 2/9  Decreased Interest 2 1 3   Down, Depressed, Hopeless 2 3 3   PHQ - 2 Score 4 4 6   Altered sleeping 3 2 0  Tired, decreased energy 3 1 3   Change in appetite 3 3 0  Feeling bad or failure about yourself  1 0 2  Trouble concentrating 0 0 3  Moving slowly or fidgety/restless 1 0 3  Suicidal thoughts 0 0 0  PHQ-9 Score 15 10 17   Difficult doing work/chores Somewhat difficult Not difficult at all Very difficult      06/02/2024    8:09 AM 12/02/2023   11:36 AM 01/06/2023   11:17 AM 07/02/2022    9:24 AM  GAD 7 : Generalized Anxiety Score  Nervous, Anxious, on Edge 1 0 1 3  Control/stop worrying 1 1 3 3   Worry too much - different things 0 2 3 3   Trouble relaxing 0 0 2 1  Restless 0 0 2 0  Easily annoyed or irritable 0 1 3 1   Afraid - awful might happen 1 3 3  0  Total GAD 7 Score 3 7 17 11   Anxiety Difficulty Not difficult at all Not difficult at all Very difficult Somewhat difficult     Past Medical History:  Diagnosis Date   Arthritis    Hyperlipidemia    Hypertension    Prediabetes    Type  2 diabetes mellitus (HCC) 07/03/2020   A1c 6.8   Uterine fibroid    Vitamin D  deficiency       ROS As per HPI.    Objective:     BP 130/73   Pulse 61   Temp 97.6 F (36.4 C) (Temporal)   Ht 5\' 10"  (1.778 m)   Wt 240 lb (108.9 kg)   LMP 03/17/2017   SpO2 99%   BMI 34.44 kg/m  Wt Readings from Last 3 Encounters:  06/02/24 240 lb (108.9 kg)  12/02/23 232 lb 3.2 oz (105.3 kg)  01/06/23 201 lb 8 oz (91.4 kg)      Physical Exam Vitals and nursing note reviewed.  Constitutional:      General: She is not in acute distress.    Appearance: Normal appearance. She is not ill-appearing, toxic-appearing or diaphoretic.  Cardiovascular:     Rate and Rhythm: Normal rate and regular rhythm.     Heart sounds: Normal heart sounds. No murmur heard. Pulmonary:     Effort: Pulmonary effort is normal. No respiratory distress.     Breath sounds: Normal breath sounds. No wheezing,  rhonchi or rales.  Skin:    General: Skin is warm and dry.  Neurological:     General: No focal deficit present.     Mental Status: She is alert and oriented to person, place, and time.  Psychiatric:        Attention and Perception: Attention normal.        Mood and Affect: Affect is tearful.        Speech: Speech normal.        Behavior: Behavior normal.        Thought Content: Thought content normal.    No results found for any visits on 06/02/24.    The 10-year ASCVD risk score (Arnett DK, et al., 2019) is: 6.2%    Assessment & Plan:   Kara Mathis was seen today for medical management of chronic issues.  Diagnoses and all orders for this visit:  Prediabetes A1c 6.1, improved from previous at 6.3. -     Bayer DCA Hb A1c Waived  Primary hypertension Well controlled on current regimen.  -     BMP8+EGFR -     TSH -     hydrochlorothiazide  (MICROZIDE ) 12.5 MG capsule; Take 1 capsule (12.5 mg total) by mouth daily. -     lisinopril  (ZESTRIL ) 40 MG tablet; Take 1 tablet (40 mg total) by mouth  daily.  Mixed hyperlipidemia Fasting panel pending. On statin.  -     Lipid panel -     atorvastatin  (LIPITOR) 10 MG tablet; Take 1 tablet (10 mg total) by mouth daily.  Vitamin D  deficiency -     VITAMIN D  25 Hydroxy (Vit-D Deficiency, Fractures)  Dry skin -     TSH  Depression, major, single episode, moderate (HCC) Anxiety Uncontrolled. Denies SI. Will start zoloft as below. Discussed counseling. She will think about this. Follow up in 6 weeks.   -     sertraline (ZOLOFT) 25 MG tablet; Take 1 tablet (25 mg total) by mouth daily.  Return in about 6 weeks (around 07/14/2024) for chronic follow up.   The patient indicates understanding of these issues and agrees with the plan.  Kara Huger, FNP

## 2024-06-03 LAB — BMP8+EGFR
BUN/Creatinine Ratio: 23 (ref 12–28)
BUN: 26 mg/dL (ref 8–27)
CO2: 19 mmol/L — ABNORMAL LOW (ref 20–29)
Calcium: 9 mg/dL (ref 8.7–10.3)
Chloride: 103 mmol/L (ref 96–106)
Creatinine, Ser: 1.12 mg/dL — ABNORMAL HIGH (ref 0.57–1.00)
Glucose: 131 mg/dL — ABNORMAL HIGH (ref 70–99)
Potassium: 4.8 mmol/L (ref 3.5–5.2)
Sodium: 140 mmol/L (ref 134–144)
eGFR: 55 mL/min/1.73 — ABNORMAL LOW

## 2024-06-03 LAB — LIPID PANEL
Chol/HDL Ratio: 3.6 ratio (ref 0.0–4.4)
Cholesterol, Total: 191 mg/dL (ref 100–199)
HDL: 53 mg/dL
LDL Chol Calc (NIH): 103 mg/dL — ABNORMAL HIGH (ref 0–99)
Triglycerides: 205 mg/dL — ABNORMAL HIGH (ref 0–149)
VLDL Cholesterol Cal: 35 mg/dL (ref 5–40)

## 2024-06-03 LAB — TSH: TSH: 1.35 u[IU]/mL (ref 0.450–4.500)

## 2024-06-03 LAB — VITAMIN D 25 HYDROXY (VIT D DEFICIENCY, FRACTURES): Vit D, 25-Hydroxy: 26.2 ng/mL — ABNORMAL LOW (ref 30.0–100.0)

## 2024-06-05 ENCOUNTER — Ambulatory Visit: Payer: Self-pay | Admitting: Family Medicine

## 2024-06-05 DIAGNOSIS — E559 Vitamin D deficiency, unspecified: Secondary | ICD-10-CM

## 2024-06-05 DIAGNOSIS — E782 Mixed hyperlipidemia: Secondary | ICD-10-CM

## 2024-06-05 MED ORDER — VITAMIN D (ERGOCALCIFEROL) 1.25 MG (50000 UNIT) PO CAPS
50000.0000 [IU] | ORAL_CAPSULE | ORAL | 0 refills | Status: DC
Start: 2024-06-05 — End: 2024-10-18

## 2024-06-05 MED ORDER — ATORVASTATIN CALCIUM 20 MG PO TABS: 20.0000 mg | ORAL_TABLET | Freq: Every day | ORAL | 3 refills | Status: AC

## 2024-06-19 ENCOUNTER — Other Ambulatory Visit: Payer: Self-pay | Admitting: *Deleted

## 2024-06-19 DIAGNOSIS — M1990 Unspecified osteoarthritis, unspecified site: Secondary | ICD-10-CM

## 2024-06-19 MED ORDER — DICLOFENAC SODIUM 75 MG PO TBEC: 75.0000 mg | DELAYED_RELEASE_TABLET | Freq: Two times a day (BID) | ORAL | 0 refills | Status: DC

## 2024-07-12 ENCOUNTER — Ambulatory Visit (INDEPENDENT_AMBULATORY_CARE_PROVIDER_SITE_OTHER): Admitting: Family Medicine

## 2024-07-12 VITALS — BP 132/70 | HR 70 | Temp 98.6°F | Ht 70.0 in | Wt 232.8 lb

## 2024-07-12 DIAGNOSIS — F321 Major depressive disorder, single episode, moderate: Secondary | ICD-10-CM

## 2024-07-12 DIAGNOSIS — E782 Mixed hyperlipidemia: Secondary | ICD-10-CM

## 2024-07-12 DIAGNOSIS — I10 Essential (primary) hypertension: Secondary | ICD-10-CM

## 2024-07-12 DIAGNOSIS — F419 Anxiety disorder, unspecified: Secondary | ICD-10-CM | POA: Diagnosis not present

## 2024-07-12 NOTE — Progress Notes (Signed)
 Established Patient Office Visit  Subjective   Patient ID: Kara Mathis, female    DOB: 1961-01-27  Age: 64 y.o. MRN: [id number]  Chief Complaint  Patient presents with   Medical Management of Chronic Issues    HPI  1. HTN Complaint with meds - Yes Current Medications - hydrochlorothiazide  12.5 mg, lisinopril  40 mg Checking BP at home ranging- 130s/80s Pertinent ROS:  Chest pain - No Dyspnea - No Palpitations - No LE edema - No  2. Anxiety/depression Feeling much better after starting zoloft . Sleep has improved. Crying has decreased. Feels like she is coping with grief now. She still lacks motivation and feels overwhelmed sometimes. Isn't overeating as often and has lost 8 lbs now. Denies side effects. Feels good on current dose.       07/12/2024    9:22 AM 06/02/2024    8:08 AM 12/02/2023   11:36 AM  Depression screen PHQ 2/9  Decreased Interest 1 2 1   Down, Depressed, Hopeless 0 2 3  PHQ - 2 Score 1 4 4   Altered sleeping 1 3 2   Tired, decreased energy 1 3 1   Change in appetite 0 3 3  Feeling bad or failure about yourself  0 1 0  Trouble concentrating 0 0 0  Moving slowly or fidgety/restless 0 1 0  Suicidal thoughts 0 0 0  PHQ-9 Score 3 15 10   Difficult doing work/chores Not difficult at all Somewhat difficult Not difficult at all      07/12/2024    9:23 AM 06/02/2024    8:09 AM 12/02/2023   11:36 AM 01/06/2023   11:17 AM  GAD 7 : Generalized Anxiety Score  Nervous, Anxious, on Edge 1 1 0 1  Control/stop worrying 0 1 1 3   Worry too much - different things 1 0 2 3  Trouble relaxing 0 0 0 2  Restless 0 0 0 2  Easily annoyed or irritable 1 0 1 3  Afraid - awful might happen 0 1 3 3   Total GAD 7 Score 3 3 7 17   Anxiety Difficulty Not difficult at all Not difficult at all Not difficult at all Very difficult     Past Medical History:  Diagnosis Date   Arthritis    Hyperlipidemia    Hypertension    Prediabetes    Type 2 diabetes mellitus (HCC) 07/03/2020    A1c 6.8   Uterine fibroid    Vitamin D  deficiency       ROS As per HPI.    Objective:     BP 132/70   Pulse 70   Temp 98.6 F (37 C) (Temporal)   Ht 5' 10 (1.778 m)   Wt 232 lb 12.8 oz (105.6 kg)   LMP 03/17/2017   SpO2 98%   BMI 33.40 kg/m  Wt Readings from Last 3 Encounters:  07/12/24 232 lb 12.8 oz (105.6 kg)  06/02/24 240 lb (108.9 kg)  12/02/23 232 lb 3.2 oz (105.3 kg)      Physical Exam Vitals and nursing note reviewed.  Constitutional:      General: She is not in acute distress.    Appearance: Normal appearance. She is not ill-appearing, toxic-appearing or diaphoretic.  Cardiovascular:     Rate and Rhythm: Normal rate and regular rhythm.     Heart sounds: Normal heart sounds. No murmur heard. Pulmonary:     Effort: Pulmonary effort is normal. No respiratory distress.     Breath sounds: Normal breath sounds. No wheezing, rhonchi  or rales.  Skin:    General: Skin is warm and dry.  Neurological:     General: No focal deficit present.     Mental Status: She is alert and oriented to person, place, and time.  Psychiatric:        Attention and Perception: Attention normal.        Mood and Affect: Mood normal. Affect is not tearful.        Speech: Speech normal.        Behavior: Behavior normal.        Thought Content: Thought content normal.    No results found for any visits on 07/12/24.    The 10-year ASCVD risk score (Arnett DK, et al., 2019) is: 9.7%    Assessment & Plan:   Kara Mathis was seen today for medical management of chronic issues.  Diagnoses and all orders for this visit:  Primary hypertension Well controlled on current regimen.   Mixed hyperlipidemia Increased statin dosage at last appt. Will repeat fasting panel at next visit.   Depression, major, single episode, moderate (HCC) Anxiety Well controlled with zoloft .   Return in about 3 months (around 10/12/2024) for medication follow up, fasting lipid panel. .   The patient  indicates understanding of these issues and agrees with the plan.  Kara CHRISTELLA Search, FNP

## 2024-07-19 ENCOUNTER — Other Ambulatory Visit: Payer: Self-pay | Admitting: Family Medicine

## 2024-07-19 DIAGNOSIS — M1990 Unspecified osteoarthritis, unspecified site: Secondary | ICD-10-CM

## 2024-10-18 ENCOUNTER — Ambulatory Visit: Admitting: Family Medicine

## 2024-10-18 VITALS — BP 130/72 | HR 54 | Temp 98.2°F | Ht 70.0 in | Wt 238.2 lb

## 2024-10-18 DIAGNOSIS — Z23 Encounter for immunization: Secondary | ICD-10-CM

## 2024-10-18 DIAGNOSIS — E782 Mixed hyperlipidemia: Secondary | ICD-10-CM | POA: Diagnosis not present

## 2024-10-18 DIAGNOSIS — E1165 Type 2 diabetes mellitus with hyperglycemia: Secondary | ICD-10-CM

## 2024-10-18 DIAGNOSIS — I10 Essential (primary) hypertension: Secondary | ICD-10-CM

## 2024-10-18 DIAGNOSIS — F411 Generalized anxiety disorder: Secondary | ICD-10-CM

## 2024-10-18 DIAGNOSIS — E559 Vitamin D deficiency, unspecified: Secondary | ICD-10-CM | POA: Diagnosis not present

## 2024-10-18 DIAGNOSIS — F339 Major depressive disorder, recurrent, unspecified: Secondary | ICD-10-CM

## 2024-10-18 LAB — BAYER DCA HB A1C WAIVED: HB A1C (BAYER DCA - WAIVED): 6.6 % — ABNORMAL HIGH (ref 4.8–5.6)

## 2024-10-18 MED ORDER — SERTRALINE HCL 50 MG PO TABS: 50.0000 mg | ORAL_TABLET | Freq: Every day | ORAL | 3 refills | Status: AC
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
## 2024-10-19 ENCOUNTER — Ambulatory Visit: Payer: Self-pay | Admitting: Family Medicine

## 2024-10-19 DIAGNOSIS — N1831 Chronic kidney disease, stage 3a: Secondary | ICD-10-CM | POA: Insufficient documentation

## 2024-10-19 LAB — CMP14+EGFR
ALT: 16 IU/L (ref 0–32)
AST: 16 IU/L (ref 0–40)
Albumin: 4.2 g/dL (ref 3.9–4.9)
Alkaline Phosphatase: 89 IU/L (ref 49–135)
BUN/Creatinine Ratio: 25 (ref 12–28)
BUN: 27 mg/dL (ref 8–27)
Bilirubin Total: 0.4 mg/dL (ref 0.0–1.2)
CO2: 22 mmol/L (ref 20–29)
Calcium: 9.2 mg/dL (ref 8.7–10.3)
Chloride: 105 mmol/L (ref 96–106)
Creatinine, Ser: 1.07 mg/dL — ABNORMAL HIGH (ref 0.57–1.00)
Globulin, Total: 2.2 g/dL (ref 1.5–4.5)
Glucose: 147 mg/dL — ABNORMAL HIGH (ref 70–99)
Potassium: 4.7 mmol/L (ref 3.5–5.2)
Sodium: 142 mmol/L (ref 134–144)
Total Protein: 6.4 g/dL (ref 6.0–8.5)
eGFR: 58 mL/min/1.73 — ABNORMAL LOW (ref >59)

## 2024-10-19 LAB — LIPID PANEL
Chol/HDL Ratio: 4.1 ratio (ref 0.0–4.4)
Cholesterol, Total: 188 mg/dL (ref 100–199)
HDL: 46 mg/dL (ref >39)
LDL Chol Calc (NIH): 111 mg/dL — ABNORMAL HIGH (ref 0–99)
Triglycerides: 178 mg/dL — ABNORMAL HIGH (ref 0–149)
VLDL Cholesterol Cal: 31 mg/dL (ref 5–40)

## 2024-10-19 LAB — VITAMIN D 25 HYDROXY (VIT D DEFICIENCY, FRACTURES): Vit D, 25-Hydroxy: 30.3 ng/mL (ref 30.0–100.0)

## 2024-10-23 ENCOUNTER — Other Ambulatory Visit: Payer: Self-pay | Admitting: Family Medicine

## 2024-10-23 DIAGNOSIS — M1990 Unspecified osteoarthritis, unspecified site: Secondary | ICD-10-CM

## 2024-11-29 ENCOUNTER — Ambulatory Visit: Payer: Self-pay | Admitting: Family Medicine

## 2024-11-29 ENCOUNTER — Encounter: Payer: Self-pay | Admitting: Family Medicine

## 2024-11-29 VITALS — BP 138/77 | HR 67 | Temp 97.9°F | Ht 70.0 in | Wt 234.8 lb

## 2024-11-29 DIAGNOSIS — I10 Essential (primary) hypertension: Secondary | ICD-10-CM

## 2024-11-29 DIAGNOSIS — J310 Chronic rhinitis: Secondary | ICD-10-CM

## 2024-11-29 DIAGNOSIS — F339 Major depressive disorder, recurrent, unspecified: Secondary | ICD-10-CM

## 2024-11-29 DIAGNOSIS — F331 Major depressive disorder, recurrent, moderate: Secondary | ICD-10-CM

## 2024-11-29 DIAGNOSIS — F411 Generalized anxiety disorder: Secondary | ICD-10-CM

## 2024-11-29 MED ORDER — LEVOCETIRIZINE DIHYDROCHLORIDE 5 MG PO TABS: 5.0000 mg | ORAL_TABLET | Freq: Every evening | ORAL | 3 refills | Status: AC

## 2024-11-29 NOTE — Progress Notes (Unsigned)
 Established Patient Office Visit  Subjective   Patient ID: Kara Mathis, female    DOB: [birth year]  Age: 63 y.o. MRN: [id number]  Chief Complaint  Patient presents with   Medical Management of Chronic Issues    HPI  History of Present Illness   Kara Mathis is a 63 year old female who presents for follow-up after an increase in Zoloft  dosage.  Mood disturbance and anxiety - Improved mood and sleep following increase in Zoloft  dosage to 50 mg - Reduction in crying episodes - Some persistent anxiety and depressive symptoms, particularly related to not completing tasks - Occasional worries about being alone and health-related concerns, but these do not significantly impact daily functioning  Upper respiratory symptoms - Sinus and allergy symptoms present for approximately one week, improving - Persistent dry cough, primarily nocturnal - Nasal congestion causing ear pressure, relieved by nose blowing - Mucinex used without significant relief - Desires additional treatment for mucus clearance - She has had recurrent rhinitis symptoms for about 1 year         11/29/2024   10:26 AM 10/18/2024    8:54 AM 07/12/2024    9:22 AM  Depression screen PHQ 2/9  Decreased Interest 1 2 1   Down, Depressed, Hopeless 1 1 0  PHQ - 2 Score 2 3 1   Altered sleeping 0 3 1  Tired, decreased energy 1 1 1   Change in appetite 0 0 0  Feeling bad or failure about yourself  0 1 0  Trouble concentrating 0 0 0  Moving slowly or fidgety/restless 0 0 0  Suicidal thoughts 0 0 0  PHQ-9 Score 3 8  3    Difficult doing work/chores Somewhat difficult Somewhat difficult Not difficult at all     Data saved with a previous flowsheet row definition      11/29/2024   10:27 AM 10/18/2024    8:54 AM 07/12/2024    9:23 AM 06/02/2024    8:09 AM  GAD 7 : Generalized Anxiety Score  Nervous, Anxious, on Edge 1 1 1 1   Control/stop worrying 0 1 0 1  Worry too much - different things 1 1 1  0  Trouble relaxing 0 1 0  0  Restless 0 0 0 0  Easily annoyed or irritable 1 0 1 0  Afraid - awful might happen 1 0 0 1  Total GAD 7 Score 4 4 3 3   Anxiety Difficulty Not difficult at all Somewhat difficult Not difficult at all Not difficult at all       ROS As per HPI.    Objective:     BP 138/77   Pulse 67   Temp 97.9 F (36.6 C) (Temporal)   Ht 5' 10 (1.778 m)   Wt 234 lb 12.8 oz (106.5 kg)   LMP 03/17/2017   SpO2 97%   BMI 33.69 kg/m  Wt Readings from Last 3 Encounters:  11/29/24 234 lb 12.8 oz (106.5 kg)  10/18/24 238 lb 3.2 oz (108 kg)  07/12/24 232 lb 12.8 oz (105.6 kg)      Physical Exam Vitals and nursing note reviewed.  Constitutional:      General: She is not in acute distress.    Appearance: Normal appearance. She is not ill-appearing.  HENT:     Right Ear: Tympanic membrane, ear canal and external ear normal.     Left Ear: Tympanic membrane, ear canal and external ear normal.     Nose: Congestion present.  Mouth/Throat:     Mouth: Mucous membranes are moist.     Pharynx: Oropharynx is clear. No pharyngeal swelling, oropharyngeal exudate, posterior oropharyngeal erythema, uvula swelling or postnasal drip.     Tonsils: No tonsillar exudate or tonsillar abscesses. 0 on the right. 0 on the left.  Eyes:     General:        Right eye: No discharge.        Left eye: No discharge.     Conjunctiva/sclera: Conjunctivae normal.  Cardiovascular:     Rate and Rhythm: Normal rate and regular rhythm.     Pulses: Normal pulses.     Heart sounds: Normal heart sounds. No murmur heard. Pulmonary:     Effort: Pulmonary effort is normal. No respiratory distress.     Breath sounds: Normal breath sounds.  Abdominal:     General: Bowel sounds are normal. There is no distension.     Palpations: Abdomen is soft. There is no mass.     Tenderness: There is no abdominal tenderness. There is no guarding or rebound.  Musculoskeletal:     Cervical back: No rigidity.     Right lower leg: No  edema.     Left lower leg: No edema.  Lymphadenopathy:     Cervical: No cervical adenopathy.  Skin:    General: Skin is warm and dry.  Neurological:     General: No focal deficit present.     Mental Status: She is alert and oriented to person, place, and time.  Psychiatric:        Mood and Affect: Mood normal.        Behavior: Behavior normal.      No results found for any visits on 11/29/24.    The 10-year ASCVD risk score (Arnett DK, et al., 2019) is: 7.4%    Assessment & Plan:   Kara Mathis was seen today for medical management of chronic issues.  Diagnoses and all orders for this visit:  Moderate recurrent major depression (HCC)  Generalized anxiety disorder  Chronic rhinitis -     levocetirizine (XYZAL) 5 MG tablet; Take 1 tablet (5 mg total) by mouth every evening.  Primary hypertension  Assessment and Plan    Major depressive disorder, recurrent Generalized anxiety disorder Well managed by zoloft .  - Continue current management with Zoloft . - Advised to return if anxiety symptoms worsen.  Chronic rhinitis Symptoms of sinus congestion, dry cough, and ear fullness improved.  - Prescribed Xyzal at bedtime for symptom relief. - Advised Xyzal can be taken daily as needed. - Discussed Xyzal availability over the counter and insurance coverage.     HTN BP at goal.   Return in about 3 months (around 02/27/2025) for chronic follow up.   The patient indicates understanding of these issues and agrees with the plan.  Kara CHRISTELLA Search, FNP

## 2024-12-22 ENCOUNTER — Other Ambulatory Visit: Payer: Self-pay | Admitting: Family Medicine

## 2024-12-22 DIAGNOSIS — M1990 Unspecified osteoarthritis, unspecified site: Secondary | ICD-10-CM

## 2024-12-22 DIAGNOSIS — I10 Essential (primary) hypertension: Secondary | ICD-10-CM

## 2025-03-01 ENCOUNTER — Ambulatory Visit: Admitting: Family Medicine
